# Patient Record
Sex: Female | Born: 1995
Health system: Southern US, Community
[De-identification: ages and names within clinical notes are randomized; demographics above are authoritative.]

## PROBLEM LIST (undated history)

## (undated) DIAGNOSIS — F319 Bipolar disorder, unspecified: Secondary | ICD-10-CM

## (undated) DIAGNOSIS — F419 Anxiety disorder, unspecified: Secondary | ICD-10-CM

## (undated) DIAGNOSIS — F32A Depression, unspecified: Secondary | ICD-10-CM

## (undated) DIAGNOSIS — O24419 Gestational diabetes mellitus in pregnancy, unspecified control: Secondary | ICD-10-CM

## (undated) DIAGNOSIS — Z789 Other specified health status: Secondary | ICD-10-CM

## (undated) HISTORY — DX: Bipolar disorder, unspecified: F31.9

## (undated) HISTORY — DX: Anxiety disorder, unspecified: F41.9

## (undated) HISTORY — DX: Depression, unspecified: F32.A

## (undated) HISTORY — PX: NO PAST SURGERIES: SHX2092

## (undated) HISTORY — DX: Gestational diabetes mellitus in pregnancy, unspecified control: O24.419

---

## 2017-10-26 ENCOUNTER — Emergency Department (HOSPITAL_COMMUNITY)
Admission: EM | Admit: 2017-10-26 | Discharge: 2017-10-26 | Disposition: A | Discharge status: Home / Self Care | Payer: Self-pay | Attending: Emergency Medicine | Admitting: Emergency Medicine

## 2017-10-26 ENCOUNTER — Encounter (HOSPITAL_COMMUNITY): Payer: Self-pay | Admitting: Emergency Medicine

## 2017-10-26 ENCOUNTER — Other Ambulatory Visit: Payer: Self-pay

## 2017-10-26 DIAGNOSIS — K029 Dental caries, unspecified: Secondary | ICD-10-CM | POA: Insufficient documentation

## 2017-10-26 DIAGNOSIS — K047 Periapical abscess without sinus: Secondary | ICD-10-CM | POA: Insufficient documentation

## 2017-10-26 MED ORDER — PENICILLIN V POTASSIUM 500 MG PO TABS: 500.0000 mg | ORAL_TABLET | Freq: Four times a day (QID) | ORAL | 0 refills | Status: AC

## 2017-10-26 MED ORDER — TRAMADOL HCL 50 MG PO TABS: 50.0000 mg | ORAL_TABLET | Freq: Four times a day (QID) | ORAL | 0 refills | Status: DC | PRN

## 2017-10-26 MED ORDER — TRAMADOL HCL 50 MG PO TABS
50.0000 mg | ORAL_TABLET | Freq: Once | ORAL | Status: AC
  Administered 2017-10-26: 50 mg via ORAL
  Filled 2017-10-26: qty 1

## 2017-10-26 MED ORDER — PENICILLIN V POTASSIUM 500 MG PO TABS
500.0000 mg | ORAL_TABLET | Freq: Once | ORAL | Status: AC
  Administered 2017-10-26: 500 mg via ORAL
  Filled 2017-10-26: qty 1

## 2017-10-26 NOTE — ED Notes (Signed)
Patient given discharge teaching and verbalized understanding. Patient ambulated out of ED with a steady gait. 

## 2017-10-26 NOTE — ED Triage Notes (Signed)
Pt c/o bilateral dental pain x several months, has tried to manage pain with OTC pain meds, pain now radiating to ears.

## 2017-10-26 NOTE — ED Provider Notes (Signed)
[facility]-EMERGENCY DEPT Provider Note   CSN: [id number] Arrival date & time: 10/26/17  1023     History   Chief Complaint Chief Complaint  Patient presents with  . Dental Pain    HPI Susan Davis is a 22 y.o. female.  HPI Patient presents with concern of pain, possible fever. She notes that over the past few months she has noticed increasing pain in the right inferior mandible, radiating towards the ear and jaw, but this has become notably worse over the past few days. She is here with her mother who notes that the patient also has had a fever recently. No difficulty swallowing or speaking, but with increasing pain in spite of using OTC medication, possible fever she presents for evaluation.    History reviewed. No pertinent past medical history.  There are no active problems to display for this patient.   History reviewed. No pertinent surgical history.   OB History   None      Home Medications    Prior to Admission medications   Medication Sig Start Date End Date Taking? Authorizing Provider  penicillin v potassium (VEETID) 500 MG tablet Take 1 tablet (500 mg total) by mouth 4 (four) times daily for 7 days. 10/26/17 11/02/17  Gerhard Munch, MD  traMADol (ULTRAM) 50 MG tablet Take 1 tablet (50 mg total) by mouth every 6 (six) hours as needed for severe pain. 10/26/17   Gerhard Munch, MD    Family History History reviewed. No pertinent family history.  Social History Social History   Tobacco Use  . Smoking status: Never Smoker  . Smokeless tobacco: Never Used  Substance Use Topics  . Alcohol use: Not Currently    Frequency: Never  . Drug use: Never     Allergies   Patient has no known allergies.   Review of Systems Review of Systems  Constitutional: Negative for fever.  Respiratory: Negative for shortness of breath.   Cardiovascular: Negative for chest pain.  Musculoskeletal:       Negative aside from HPI    Skin:       Negative aside from HPI  Allergic/Immunologic: Negative for immunocompromised state.  Neurological: Negative for weakness.     Physical Exam Updated Vital Signs BP 116/90   Pulse 77   Temp 98.5 F (36.9 C) (Oral)   Resp 18   Wt 68 kg   LMP 10/24/2017 (Exact Date)   SpO2 98%   Physical Exam  Constitutional: She is oriented to person, place, and time. She appears well-developed and well-nourished. No distress.  HENT:  Head: Normocephalic and atraumatic.  Mouth/Throat: Uvula is midline and oropharynx is clear and moist. Abnormal dentition. Dental caries present. No dental abscesses.    Mild tenderness palpation in the right inferior mandible  Eyes: Conjunctivae and EOM are normal.  Cardiovascular: Normal rate and regular rhythm.  Pulmonary/Chest: Effort normal and breath sounds normal. No stridor. No respiratory distress.  Abdominal: She exhibits no distension.  Musculoskeletal: She exhibits no edema.  Neurological: She is alert and oriented to person, place, and time. No cranial nerve deficit.  Skin: Skin is warm and dry.  Psychiatric: She has a normal mood and affect.  Nursing note and vitals reviewed.    ED Treatments / Results  Labs (all labs ordered are listed, but only abnormal results are displayed) Labs Reviewed - No data to display  EKG None  Radiology No results found.  Procedures Procedures (including critical care time)  Medications  Ordered in ED Medications  penicillin v potassium (VEETID) tablet 500 mg (has no administration in time range)  traMADol (ULTRAM) tablet 50 mg (has no administration in time range)     Initial Impression / Assessment and Plan / ED Course  I have reviewed the triage vital signs and the nursing notes.  Pertinent labs & imaging results that were available during my care of the patient were reviewed by me and considered in my medical decision making (see chart for details).  Young female presents with  concern of fever, mouth pain. No evidence for bacteremia, sepsis, some suspicion for dental infection given her caries Patient started on antibiotics, analgesia provided resources for dental follow-up.  Final Clinical Impressions(s) / ED Diagnoses   Final diagnoses:  Dental infection    ED Discharge Orders         Ordered    penicillin v potassium (VEETID) 500 MG tablet  4 times daily     10/26/17 1044    traMADol (ULTRAM) 50 MG tablet  Every 6 hours PRN     10/26/17 1044           Gerhard Munch, MD 10/26/17 1047

## 2017-10-26 NOTE — Discharge Instructions (Signed)
Please be sure to use the provided resources to follow-up with a dentist.  Return here for concerning changes in your condition.

## 2018-05-18 ENCOUNTER — Emergency Department (HOSPITAL_COMMUNITY)
Admission: EM | Admit: 2018-05-18 | Discharge: 2018-05-18 | Disposition: A | Discharge status: Other Acute Inpatient Hospital | Payer: Self-pay | Attending: Emergency Medicine | Admitting: Emergency Medicine

## 2018-05-18 ENCOUNTER — Other Ambulatory Visit: Payer: Self-pay | Admitting: Behavioral Health

## 2018-05-18 ENCOUNTER — Inpatient Hospital Stay (HOSPITAL_COMMUNITY)
Admission: AD | Admit: 2018-05-18 | Discharge: 2018-05-21 | DRG: 885 | Disposition: A | Discharge status: Home / Self Care | Payer: No Typology Code available for payment source | Source: Intra-hospital | Attending: Psychiatry | Admitting: Psychiatry

## 2018-05-18 ENCOUNTER — Other Ambulatory Visit: Payer: Self-pay

## 2018-05-18 ENCOUNTER — Encounter (HOSPITAL_COMMUNITY): Payer: Self-pay | Admitting: Emergency Medicine

## 2018-05-18 ENCOUNTER — Encounter (HOSPITAL_COMMUNITY): Payer: Self-pay

## 2018-05-18 DIAGNOSIS — F329 Major depressive disorder, single episode, unspecified: Secondary | ICD-10-CM | POA: Diagnosis present

## 2018-05-18 DIAGNOSIS — F3173 Bipolar disorder, in partial remission, most recent episode manic: Secondary | ICD-10-CM

## 2018-05-18 DIAGNOSIS — F1410 Cocaine abuse, uncomplicated: Secondary | ICD-10-CM | POA: Diagnosis present

## 2018-05-18 DIAGNOSIS — R45851 Suicidal ideations: Secondary | ICD-10-CM | POA: Insufficient documentation

## 2018-05-18 DIAGNOSIS — F1210 Cannabis abuse, uncomplicated: Secondary | ICD-10-CM | POA: Insufficient documentation

## 2018-05-18 DIAGNOSIS — F3189 Other bipolar disorder: Principal | ICD-10-CM | POA: Diagnosis present

## 2018-05-18 DIAGNOSIS — F319 Bipolar disorder, unspecified: Secondary | ICD-10-CM | POA: Diagnosis not present

## 2018-05-18 DIAGNOSIS — Z91410 Personal history of adult physical and sexual abuse: Secondary | ICD-10-CM

## 2018-05-18 DIAGNOSIS — G2401 Drug induced subacute dyskinesia: Secondary | ICD-10-CM | POA: Diagnosis present

## 2018-05-18 DIAGNOSIS — F1110 Opioid abuse, uncomplicated: Secondary | ICD-10-CM | POA: Diagnosis present

## 2018-05-18 DIAGNOSIS — Z79899 Other long term (current) drug therapy: Secondary | ICD-10-CM

## 2018-05-18 DIAGNOSIS — F482 Pseudobulbar affect: Secondary | ICD-10-CM | POA: Diagnosis present

## 2018-05-18 DIAGNOSIS — F1220 Cannabis dependence, uncomplicated: Secondary | ICD-10-CM | POA: Diagnosis present

## 2018-05-18 DIAGNOSIS — F1510 Other stimulant abuse, uncomplicated: Secondary | ICD-10-CM | POA: Diagnosis present

## 2018-05-18 DIAGNOSIS — Z91411 Personal history of adult psychological abuse: Secondary | ICD-10-CM

## 2018-05-18 DIAGNOSIS — Z03818 Encounter for observation for suspected exposure to other biological agents ruled out: Secondary | ICD-10-CM | POA: Insufficient documentation

## 2018-05-18 DIAGNOSIS — F332 Major depressive disorder, recurrent severe without psychotic features: Secondary | ICD-10-CM | POA: Insufficient documentation

## 2018-05-18 HISTORY — DX: Other specified health status: Z78.9

## 2018-05-18 LAB — CBC WITH DIFFERENTIAL/PLATELET
Abs Immature Granulocytes: 0.04 10*3/uL (ref 0.00–0.07)
Basophils Absolute: 0.1 10*3/uL (ref 0.0–0.1)
Basophils Relative: 1 %
Eosinophils Absolute: 0.3 10*3/uL (ref 0.0–0.5)
Eosinophils Relative: 2 %
HCT: 40.2 % (ref 36.0–46.0)
Hemoglobin: 13.6 g/dL (ref 12.0–15.0)
Immature Granulocytes: 0 %
Lymphocytes Relative: 33 %
Lymphs Abs: 3.5 10*3/uL (ref 0.7–4.0)
MCH: 29.5 pg (ref 26.0–34.0)
MCHC: 33.8 g/dL (ref 30.0–36.0)
MCV: 87.2 fL (ref 80.0–100.0)
Monocytes Absolute: 0.8 10*3/uL (ref 0.1–1.0)
Monocytes Relative: 7 %
Neutro Abs: 6.2 10*3/uL (ref 1.7–7.7)
Neutrophils Relative %: 57 %
Platelets: 391 10*3/uL (ref 150–400)
RBC: 4.61 MIL/uL (ref 3.87–5.11)
RDW: 12.7 % (ref 11.5–15.5)
WBC: 10.9 10*3/uL — ABNORMAL HIGH (ref 4.0–10.5)
nRBC: 0 % (ref 0.0–0.2)

## 2018-05-18 LAB — COMPREHENSIVE METABOLIC PANEL
ALT: 11 U/L (ref 0–44)
AST: 17 U/L (ref 15–41)
Albumin: 4.5 g/dL (ref 3.5–5.0)
Alkaline Phosphatase: 65 U/L (ref 38–126)
Anion gap: 11 (ref 5–15)
BUN: 9 mg/dL (ref 6–20)
CO2: 18 mmol/L — ABNORMAL LOW (ref 22–32)
Calcium: 9 mg/dL (ref 8.9–10.3)
Chloride: 109 mmol/L (ref 98–111)
Creatinine, Ser: 0.8 mg/dL (ref 0.44–1.00)
GFR calc Af Amer: >60 mL/min (ref >60)
GFR calc non Af Amer: >60 mL/min (ref >60)
Glucose, Bld: 80 mg/dL (ref 70–99)
Potassium: 3.5 mmol/L (ref 3.5–5.1)
Sodium: 138 mmol/L (ref 135–145)
Total Bilirubin: 0.8 mg/dL (ref 0.3–1.2)
Total Protein: 8 g/dL (ref 6.5–8.1)

## 2018-05-18 LAB — RAPID URINE DRUG SCREEN, HOSP PERFORMED
Amphetamines: NOT DETECTED
Barbiturates: NOT DETECTED
Benzodiazepines: NOT DETECTED
Cocaine: NOT DETECTED
Opiates: NOT DETECTED
Tetrahydrocannabinol: POSITIVE — AB

## 2018-05-18 LAB — SALICYLATE LEVEL: Salicylate Lvl: <7 mg/dL (ref 2.8–30.0)

## 2018-05-18 LAB — PREGNANCY, URINE: Preg Test, Ur: NEGATIVE

## 2018-05-18 LAB — ETHANOL: Alcohol, Ethyl (B): <10 mg/dL (ref <10)

## 2018-05-18 LAB — ACETAMINOPHEN LEVEL: Acetaminophen (Tylenol), Serum: <10 ug/mL — ABNORMAL LOW (ref 10–30)

## 2018-05-18 MED ORDER — ALUM & MAG HYDROXIDE-SIMETH 200-200-20 MG/5ML PO SUSP: 30.0000 mL | Freq: Four times a day (QID) | ORAL | Status: DC | PRN

## 2018-05-18 MED ORDER — ACETAMINOPHEN 325 MG PO TABS
650.0000 mg | ORAL_TABLET | Freq: Four times a day (QID) | ORAL | Status: DC | PRN
  Administered 2018-05-19 – 2018-05-21 (×2): 650 mg via ORAL
  Filled 2018-05-18 (×2): qty 2

## 2018-05-18 MED ORDER — LORAZEPAM 1 MG PO TABS
1.0000 mg | ORAL_TABLET | Freq: Once | ORAL | Status: AC
  Administered 2018-05-18: 1 mg via ORAL
  Filled 2018-05-18: qty 1

## 2018-05-18 MED ORDER — LIDOCAINE-PRILOCAINE 2.5-2.5 % EX CREA: TOPICAL_CREAM | Freq: Once | CUTANEOUS | Status: DC

## 2018-05-18 MED ORDER — ALUM & MAG HYDROXIDE-SIMETH 200-200-20 MG/5ML PO SUSP: 30.0000 mL | ORAL | Status: DC | PRN

## 2018-05-18 MED ORDER — ACETAMINOPHEN 325 MG PO TABS: 650.0000 mg | ORAL_TABLET | ORAL | Status: DC | PRN

## 2018-05-18 MED ORDER — MAGNESIUM HYDROXIDE 400 MG/5ML PO SUSP: 30.0000 mL | Freq: Every day | ORAL | Status: DC | PRN

## 2018-05-18 MED ORDER — ONDANSETRON HCL 4 MG PO TABS: 4.0000 mg | ORAL_TABLET | Freq: Three times a day (TID) | ORAL | Status: DC | PRN

## 2018-05-18 MED ORDER — HYDROXYZINE HCL 25 MG PO TABS
25.0000 mg | ORAL_TABLET | Freq: Three times a day (TID) | ORAL | Status: DC | PRN
  Administered 2018-05-18 – 2018-05-21 (×4): 25 mg via ORAL
  Filled 2018-05-18 (×4): qty 1

## 2018-05-18 MED ORDER — TRAZODONE HCL 50 MG PO TABS
50.0000 mg | ORAL_TABLET | Freq: Every evening | ORAL | Status: DC | PRN
  Administered 2018-05-18 – 2018-05-21 (×3): 50 mg via ORAL
  Filled 2018-05-18 (×3): qty 1

## 2018-05-18 NOTE — BHH Counselor (Signed)
  [facility] Disposition Note  Susan Jacks, NP accepted pt into [facility] room 302 bed 01 the attending provider will be Dr. Nehemiah Massed, MD.  The pt can arrive at 9:00 pm on 05/18/18 and report can be called in at [phone].  Nachelle Negrette L. Remus Hagedorn, MS, [facility], NCC Triage Specialist
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
## 2018-05-18 NOTE — ED Notes (Signed)
Pt dressed out in wine colored scrubs and yellow socks.

## 2018-05-18 NOTE — Progress Notes (Signed)
[facility] NOVEL CORONAVIRUS (COVID-19) DAILY CHECK-OFF SYMPTOMS - answer yes or no to each - every day NO YES  Have you had a fever in the past 24 hours?  . Fever (Temp > 37.80C / 100F) X   Have you had any of these symptoms in the past 24 hours? . New Cough .  Sore Throat  .  Shortness of Breath .  Difficulty Breathing .  Unexplained Body Aches   X   Have you had any one of these symptoms in the past 24 hours not related to allergies?   . Runny Nose .  Nasal Congestion .  Sneezing   X   If you have had runny nose, nasal congestion, sneezing in the past 24 hours, has it worsened?  X   EXPOSURES - check yes or no X   Have you traveled outside the state in the past 14 days?  X   Have you been in contact with someone with a confirmed diagnosis of COVID-19 or PUI in the past 14 days without wearing appropriate PPE?  X   Have you been living in the same home as a person with confirmed diagnosis of COVID-19 or a PUI (household contact)?    X   Have you been diagnosed with COVID-19?    X              What to do next: Answered NO to all: Answered YES to anything:   Proceed with unit schedule Follow the [facility] Inpatient Flowsheet.   

## 2018-05-18 NOTE — ED Notes (Addendum)
Attempt once to collect labs unsuccessful due to PT movement. PT refuse to hold arm down

## 2018-05-18 NOTE — ED Notes (Signed)
Pt uncooperative with blood draw, jerking and pulling away, ultimately refusing Lab draw.

## 2018-05-18 NOTE — ED Provider Notes (Signed)
[facility]-EMER[facility]PT Provider Note   CSN: [id number] Arrival date & time: 05/18/18  1119    History   Chief Complaint Chief Complaint  Patient presents with  . Suicidal  . Hallucinations    HPI Susan Davis is a 23 y.o. female.     HPI   23 yo F here with worsening depression. Pt states that she has a h/o depression, has been hospitalized "a few times" for it. Has been off all meds. She reports that due to multiple life stressors, she has had worsening depression which she describes as poor mood. She has also had some auditory hallucinations telling her ot harm herself and others. She states that she is getting extremely frustrated with her family and feels like she has no one to talk to about her depression. She states she has poor coping skills and wants help. She feels like if she does not get help, she will harm herself. No specific plan at this time.  History reviewed. No pertinent past medical history.  There are no active problems to display for this patient.   History reviewed. No pertinent surgical history.   OB History   No obstetric history on file.      Home Medications    Prior to Admission medications   Medication Sig Start Date End Date Taking? Authorizing Provider  diphenhydrAMINE (BENADRYL) 25 mg capsule Take 25 mg by mouth every 6 (six) hours as needed for allergies or sleep.   Yes [provider]  loratadine (CLARITIN) 10 MG tablet Take 10 mg by mouth daily.   Yes [provider]  traMADol (ULTRAM) 50 MG tablet Take 1 tablet (50 mg total) by mouth every 6 (six) hours as needed for severe pain. Patient not taking: Reported on 05/18/2018 10/26/17   Gerhard Munch, MD    Family History No family history on file.  Social History Social History   Tobacco Use  . Smoking status: Never Smoker  . Smokeless tobacco: Never Used  Substance Use Topics  . Alcohol use: Not Currently    Frequency: Never   . Drug use: Never     Allergies   Patient has no known allergies.   Review of Systems Review of Systems  Psychiatric/Behavioral: Positive for decreased concentration, dysphoric mood and suicidal ideas. The patient is nervous/anxious.   All other systems reviewed and are negative.    Physical Exam Updated Vital Signs BP (!) 152/84 (BP Location: Right Arm)   Pulse 94   Temp 99 F (37.2 C) (Oral)   Resp 20   LMP 04/21/2018   SpO2 100%   Physical Exam Vitals signs and nursing note reviewed.  Constitutional:      General: She is not in acute distress.    Appearance: She is well-developed.  HENT:     Head: Normocephalic and atraumatic.  Eyes:     Conjunctiva/sclera: Conjunctivae normal.  Neck:     Musculoskeletal: Neck supple.  Cardiovascular:     Rate and Rhythm: Normal rate and regular rhythm.     Heart sounds: Normal heart sounds. No murmur. No friction rub.  Pulmonary:     Effort: Pulmonary effort is normal. No respiratory distress.     Breath sounds: Normal breath sounds. No wheezing or rales.  Abdominal:     General: There is no distension.     Palpations: Abdomen is soft.     Tenderness: There is no abdominal tenderness.  Skin:    General: Skin is  warm.     Capillary Refill: Capillary refill takes less than 2 seconds.  Neurological:     Mental Status: She is alert and oriented to person, place, and time.     Motor: No abnormal muscle tone.  Psychiatric:        Mood and Affect: Mood is depressed.        Behavior: Behavior is agitated.      ED Treatments / Results  Labs (all labs ordered are listed, but only abnormal results are displayed) Labs Reviewed  COMPREHENSIVE METABOLIC PANEL - Abnormal; Notable for the following components:      Result Value   CO2 18 (*)    All other components within normal limits  RAPID URINE DRUG SCREEN, HOSP PERFORMED - Abnormal; Notable for the following components:   Tetrahydrocannabinol POSITIVE (*)    All other  components within normal limits  CBC WITH DIFFERENTIAL/PLATELET - Abnormal; Notable for the following components:   WBC 10.9 (*)    All other components within normal limits  ACETAMINOPHEN LEVEL - Abnormal; Notable for the following components:   Acetaminophen (Tylenol), Serum <10 (*)    All other components within normal limits  ETHANOL  SALICYLATE LEVEL  PREGNANCY, URINE    EKG EKG Interpretation  Date/Time:  Monday May 18 2018 12:00:34 EDT Ventricular Rate:  83 PR Interval:    QRS Duration: 82 QT Interval:  367 QTC Calculation: 432 R Axis:   76 Text Interpretation:  Sinus rhythm Borderline short PR interval Consider left atrial enlargement No old tracing to compare Reconfirmed by Shaune PollackIsaacs, Mathhew Buysse [phone]) on 05/18/2018 12:10:20 PM   Radiology No results found.  Procedures Procedures (including critical care time)  Medications Ordered in ED Medications  lidocaine-prilocaine (EMLA) cream ( Topical Not Given 05/18/18 1445)  ondansetron (ZOFRAN) tablet 4 mg (has no administration in time range)  LORazepam (ATIVAN) tablet 1 mg (1 mg Oral Given 05/18/18 1228)     Initial Impression / Assessment and Plan / ED Course  I have reviewed the triage vital signs and the nursing notes.  Pertinent labs & imaging results that were available during my care of the patient were reviewed by me and considered in my medical decision making (see chart for details).  Clinical Course as of May 18 1919  Mon May 18, 2018  1149 23 yo F here with worsening depression, SI, AH. Suspect acute on chronic mood d/o, question of prior BPAD. No medical complaints. Screening labs sent, will d/w Psych.   [CI]  1358 Pt refusing labs. Will place EMLA, and she was notified of importance. She remains HDS, well appearing. No apparent emergent medical pathology.    [CI]    Clinical Course User Index [CI] Shaune PollackIsaacs, Mehtab Dolberry, MD        Final Clinical Impressions(s) / ED Diagnoses   Final diagnoses:  Suicidal  ideation  Marijuana abuse    ED Discharge Orders    None       Shaune PollackIsaacs, Divine Hansley, MD 05/18/18 1921

## 2018-05-18 NOTE — ED Triage Notes (Signed)
Pt reports that she wants some "mental help". Pt has been depressed and having SI thoughts for years. Reports auditory hallucinations, denies them telling her to harm herself. Denies plan of SI to this RN at this time. Reports used to be on medications but hasnt taken any in years.

## 2018-05-18 NOTE — Progress Notes (Signed)
UA cup given. Pt verbalized understanding. Pending sample.  

## 2018-05-18 NOTE — ED Notes (Signed)
Called [facility] for transport. 
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
## 2018-05-18 NOTE — Tx Team (Signed)
Initial Treatment Plan 05/18/2018 11:13 PM Susan Davis GYJ:[id number]    PATIENT STRESSORS: Marital or family conflict Substance abuse   PATIENT STRENGTHS: Active sense of humor Communication skills General fund of knowledge Special hobby/interest Supportive family/friends Work skills   PATIENT IDENTIFIED PROBLEMS: "Anxiety"   "Depression"   "At risk for suicide"                  DISCHARGE CRITERIA:  Ability to meet basic life and health needs Improved stabilization in mood, thinking, and/or behavior Verbal commitment to aftercare and medication compliance  PRELIMINARY DISCHARGE PLAN: Attend PHP/IOP Outpatient therapy Return to previous living arrangement Return to previous work or school arrangements  PATIENT/FAMILY INVOLVEMENT: This treatment plan has been presented to and reviewed with the patient, [name].The patient have been given the opportunity to ask questions and make suggestions.  Tyrone Apple, RN 05/18/2018, 11:13 PM

## 2018-05-19 DIAGNOSIS — F3173 Bipolar disorder, in partial remission, most recent episode manic: Secondary | ICD-10-CM

## 2018-05-19 DIAGNOSIS — F319 Bipolar disorder, unspecified: Secondary | ICD-10-CM

## 2018-05-19 LAB — CBC
HCT: 41.1 % (ref 36.0–46.0)
Hemoglobin: 13.3 g/dL (ref 12.0–15.0)
MCH: 28.5 pg (ref 26.0–34.0)
MCHC: 32.4 g/dL (ref 30.0–36.0)
MCV: 88 fL (ref 80.0–100.0)
Platelets: 419 10*3/uL — ABNORMAL HIGH (ref 150–400)
RBC: 4.67 MIL/uL (ref 3.87–5.11)
RDW: 12.6 % (ref 11.5–15.5)
WBC: 9.2 10*3/uL (ref 4.0–10.5)
nRBC: 0 % (ref 0.0–0.2)

## 2018-05-19 LAB — COMPREHENSIVE METABOLIC PANEL
ALT: 13 U/L (ref 0–44)
AST: 17 U/L (ref 15–41)
Albumin: 4.6 g/dL (ref 3.5–5.0)
Alkaline Phosphatase: 65 U/L (ref 38–126)
Anion gap: 11 (ref 5–15)
BUN: 11 mg/dL (ref 6–20)
CO2: 17 mmol/L — ABNORMAL LOW (ref 22–32)
Calcium: 9.1 mg/dL (ref 8.9–10.3)
Chloride: 107 mmol/L (ref 98–111)
Creatinine, Ser: 0.82 mg/dL (ref 0.44–1.00)
GFR calc Af Amer: >60 mL/min (ref >60)
GFR calc non Af Amer: >60 mL/min (ref >60)
Glucose, Bld: 79 mg/dL (ref 70–99)
Potassium: 3.3 mmol/L — ABNORMAL LOW (ref 3.5–5.1)
Sodium: 135 mmol/L (ref 135–145)
Total Bilirubin: 0.7 mg/dL (ref 0.3–1.2)
Total Protein: 8.1 g/dL (ref 6.5–8.1)

## 2018-05-19 LAB — LIPID PANEL
Cholesterol: 149 mg/dL (ref 0–200)
HDL: 57 mg/dL (ref >40)
LDL Cholesterol: 81 mg/dL (ref 0–99)
Total CHOL/HDL Ratio: 2.6 RATIO
Triglycerides: 56 mg/dL (ref <150)
VLDL: 11 mg/dL (ref 0–40)

## 2018-05-19 LAB — TSH: TSH: 1.667 u[IU]/mL (ref 0.350–4.500)

## 2018-05-19 MED ORDER — ZIPRASIDONE MESYLATE 20 MG IM SOLR: 20.0000 mg | Freq: Two times a day (BID) | INTRAMUSCULAR | Status: DC | PRN

## 2018-05-19 MED ORDER — DEXTROMETHORPHAN-QUINIDINE 20-10 MG PO CAPS
1.0000 | ORAL_CAPSULE | Freq: Two times a day (BID) | ORAL | Status: DC
  Administered 2018-05-20 – 2018-05-21 (×3): 1 via ORAL
  Filled 2018-05-19 (×3): qty 1
  Filled 2018-05-19: qty 14
  Filled 2018-05-19 (×2): qty 1
  Filled 2018-05-19: qty 14
  Filled 2018-05-19: qty 1

## 2018-05-19 MED ORDER — CARBAMAZEPINE 100 MG PO CHEW
100.0000 mg | CHEWABLE_TABLET | Freq: Three times a day (TID) | ORAL | Status: DC
  Administered 2018-05-19: 100 mg via ORAL
  Filled 2018-05-19 (×6): qty 1

## 2018-05-19 MED ORDER — CARBAMAZEPINE 200 MG PO TABS
200.0000 mg | ORAL_TABLET | Freq: Three times a day (TID) | ORAL | Status: DC
  Administered 2018-05-20 – 2018-05-21 (×4): 200 mg via ORAL
  Filled 2018-05-19: qty 21
  Filled 2018-05-19 (×4): qty 1
  Filled 2018-05-19: qty 21
  Filled 2018-05-19 (×4): qty 1
  Filled 2018-05-19: qty 21

## 2018-05-19 MED ORDER — ZIPRASIDONE MESYLATE 20 MG IM SOLR
20.0000 mg | Freq: Once | INTRAMUSCULAR | Status: AC
  Administered 2018-05-19: 20 mg via INTRAMUSCULAR
  Filled 2018-05-19 (×2): qty 20

## 2018-05-19 MED ORDER — LORAZEPAM 1 MG PO TABS: 1.0000 mg | ORAL_TABLET | ORAL | Status: DC | PRN

## 2018-05-19 MED ORDER — ZIPRASIDONE MESYLATE 20 MG IM SOLR: 20.0000 mg | Freq: Four times a day (QID) | INTRAMUSCULAR | Status: DC | PRN

## 2018-05-19 MED ORDER — BENZOCAINE 10 % MT GEL
Freq: Four times a day (QID) | OROMUCOSAL | Status: DC | PRN
  Administered 2018-05-19: 1 via OROMUCOSAL
  Administered 2018-05-19: 15:00:00 via OROMUCOSAL
  Filled 2018-05-19: qty 9.4

## 2018-05-19 MED ORDER — PROPRANOLOL HCL 20 MG PO TABS
20.0000 mg | ORAL_TABLET | Freq: Two times a day (BID) | ORAL | Status: DC
  Administered 2018-05-20 – 2018-05-21 (×3): 20 mg via ORAL
  Filled 2018-05-19: qty 2
  Filled 2018-05-19 (×8): qty 1

## 2018-05-19 MED ORDER — FLUOXETINE HCL 20 MG PO CAPS
20.0000 mg | ORAL_CAPSULE | Freq: Every day | ORAL | Status: DC
  Administered 2018-05-19 – 2018-05-21 (×3): 20 mg via ORAL
  Filled 2018-05-19: qty 7
  Filled 2018-05-19 (×5): qty 1

## 2018-05-19 MED ORDER — TEMAZEPAM 30 MG PO CAPS
30.0000 mg | ORAL_CAPSULE | Freq: Every day | ORAL | Status: DC
  Administered 2018-05-19 – 2018-05-21 (×2): 30 mg via ORAL
  Filled 2018-05-19 (×2): qty 1

## 2018-05-19 MED ORDER — CLONAZEPAM 0.5 MG PO TABS
0.5000 mg | ORAL_TABLET | Freq: Three times a day (TID) | ORAL | Status: AC
  Administered 2018-05-19 – 2018-05-21 (×5): 0.5 mg via ORAL
  Filled 2018-05-19 (×5): qty 1
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
## 2018-05-19 NOTE — Progress Notes (Signed)
Patient ID: Susan Davis, female   DOB: 05/30/1995, 23 y.o.   MRN: [id number]  [place] NOVEL CORONAVIRUS (COVID-19) DAILY CHECK-OFF SYMPTOMS - answer yes or no to each - every day NO YES  Have you had a fever in the past 24 hours?  . Fever (Temp > 37.80C / 100F) X   Have you had any of these symptoms in the past 24 hours? . New Cough .  Sore Throat  .  Shortness of Breath .  Difficulty Breathing .  Unexplained Body Aches   X   Have you had any one of these symptoms in the past 24 hours not related to allergies?   . Runny Nose .  Nasal Congestion .  Sneezing   X   If you have had runny nose, nasal congestion, sneezing in the past 24 hours, has it worsened?  X   EXPOSURES - check yes or no X   Have you traveled outside the state in the past 14 days?  X   Have you been in contact with someone with a confirmed diagnosis of COVID-19 or PUI in the past 14 days without wearing appropriate PPE?  X   Have you been living in the same home as a person with confirmed diagnosis of COVID-19 or a PUI (household contact)?    X   Have you been diagnosed with COVID-19?    X              What to do next: Answered NO to all: Answered YES to anything:   Proceed with unit schedule Follow the [facility] Inpatient Flowsheet.

## 2018-05-19 NOTE — Plan of Care (Signed)
Nursing Progress Note [id number]  On initial approach, patient is seen up in the milieu. Patient is loud with intrusive behavior and disorganized, tangential speech. Patient appears to have flight of ideas and affect is very labile. Patient initially pleasant with writer, then became paranoid about taking medications and then tearful. Patient was provided PRN Vistaril this morning. Patient has been seen attempting to disrobe and requires redirection to put scrubs on. Patient is seen hovering at the nurses's station and frequently interrupts staff. MD met with patient and is to move to 500 hall when a bed becomes available. Patient currently denies SI/HI but does appear to be responding to internal stimuli.  Patient is educated about and provided medication per provider's orders. Patient safety maintained with q15 min safety checks and low fall risk precautions. Emotional support given, 1:1 interaction, and active listening provided. Patient encouraged to attend meals, groups, and work on treatment plan and goals. Labs, vital signs and patient behavior monitored throughout shift.   Patient remains safe on the unit at this time. Will continue to support and monitor.   Problem: Safety: Goal: Periods of time without injury will increase Outcome: Progressing   Problem: Nutritional: Goal: Ability to achieve adequate nutritional intake will improve Outcome: Progressing   Problem: Role Relationship: Goal: Ability to interact with others will improve Outcome: Progressing

## 2018-05-20 MED ORDER — RISPERIDONE 2 MG PO TABS
2.0000 mg | ORAL_TABLET | Freq: Three times a day (TID) | ORAL | Status: DC
  Administered 2018-05-20 – 2018-05-21 (×3): 2 mg via ORAL
  Filled 2018-05-20 (×9): qty 1

## 2018-05-20 MED ORDER — BENZTROPINE MESYLATE 0.5 MG PO TABS
0.5000 mg | ORAL_TABLET | Freq: Two times a day (BID) | ORAL | Status: DC
  Administered 2018-05-20 – 2018-05-21 (×3): 0.5 mg via ORAL
  Filled 2018-05-20: qty 1
  Filled 2018-05-20: qty 14
  Filled 2018-05-20 (×3): qty 1
  Filled 2018-05-20: qty 14
  Filled 2018-05-20: qty 1

## 2018-05-20 MED ORDER — LORATADINE 10 MG PO TABS
10.0000 mg | ORAL_TABLET | Freq: Every day | ORAL | Status: DC
  Administered 2018-05-20 – 2018-05-21 (×2): 10 mg via ORAL
  Filled 2018-05-20: qty 7
  Filled 2018-05-20 (×3): qty 1

## 2018-05-20 MED ORDER — RISPERIDONE 2 MG PO TABS: 2.0000 mg | ORAL_TABLET | Freq: Two times a day (BID) | ORAL | Status: DC

## 2018-05-20 NOTE — Progress Notes (Signed)
Recreation Therapy Notes  INPATIENT RECREATION THERAPY ASSESSMENT  Patient Details Name: Susan Davis MRN: [id number] DOB: 04/11/1995 Today's Date: 05/20/2018       Information Obtained From: Patient  Able to Participate in Assessment/Interview: Yes  Patient Presentation: Alert, Anxious  Reason for Admission (Per Patient): Other (Comments)(Pt stated she needed help)  Patient Stressors: Family, Relationship, Work, Other (Comment)(Financial)  Coping Skills:   Isolation, Self-Injury, TV, Sports, Arguments, Aggression, Music, Exercise, Deep Breathing, Meditate, Substance Abuse, Hot Bath/Shower, Impulsivity, Prayer, Avoidance, Intrusive Behavior, Read, Dance, Talk  Leisure Interests (2+):  Sports - Other (Comment), Individual - Phone, Individual - TV, Individual - Other (Comment)(Watch youtube, Cooking shows, Smoke weed)  Frequency of Recreation/Participation: Other (Comment)(Daily)  Awareness of Community Resources:  Yes  Community Resources:  [employer], [place], Public affairs consultant  Current Use: No  If no, Barriers?: Other (Comment)(Afraid of being judged)  Expressed Interest in [employer] Information: No  [place] of Residence:  [place]  Patient Main Form of Transportation: Car  Patient Strengths:  Care for people; Love animals  Patient Identified Areas of Improvement:  Anger; Sadness  Patient Goal for Hospitalization:  "to get help to become best version of myself"  Current SI (including self-harm):  No  Current HI:  No  Current AVH: No  Staff Intervention Plan: Group Attendance, Collaborate with Interdisciplinary Treatment Team  Consent to Intern Participation: N/A    Caroll Rancher, LRT/CTRS  Caroll Rancher A 05/20/2018, 12:33 PM

## 2018-05-20 NOTE — Tx Team (Signed)
Interdisciplinary Treatment and Diagnostic Plan Update  05/20/2018 Time of Session: 4010 Tirzah Yaslene Lindamood MRN: [id number]  Principal Diagnosis: <principal problem not specified>  Secondary Diagnoses: Active Problems:   MDD (major depressive disorder)   Bipolar affective disorder, rapid cycling (HCC)   Current Medications:  Current Facility-Administered Medications  Medication Dose Route Frequency Provider Last Rate Last Dose  . acetaminophen (TYLENOL) tablet 650 mg  650 mg Oral Q6H PRN Derrill Center, NP   650 mg at 05/19/18 1140  . alum & mag hydroxide-simeth (MAALOX/MYLANTA) 200-200-20 MG/5ML suspension 30 mL  30 mL Oral Q4H PRN Derrill Center, NP      . benzocaine (ORAJEL) 10 % mucosal gel   Mouth/Throat QID PRN Cobos, Myer Peer, MD      . benztropine (COGENTIN) tablet 0.5 mg  0.5 mg Oral BID Johnn Hai, MD      . carbamazepine (TEGRETOL) tablet 200 mg  200 mg Oral TID Sharma Covert, MD   200 mg at 05/20/18 0744  . clonazePAM (KLONOPIN) tablet 0.5 mg  0.5 mg Oral TID Johnn Hai, MD   0.5 mg at 05/20/18 0744  . Dextromethorphan-quiNIDine (NUEDEXTA) 20-10 MG per capsule 1 capsule  1 capsule Oral BID Johnn Hai, MD   1 capsule at 05/20/18 0744  . FLUoxetine (PROZAC) capsule 20 mg  20 mg Oral Daily Johnn Hai, MD   20 mg at 05/20/18 0744  . hydrOXYzine (ATARAX/VISTARIL) tablet 25 mg  25 mg Oral TID PRN Derrill Center, NP   25 mg at 05/19/18 2124  . ziprasidone (GEODON) injection 20 mg  20 mg Intramuscular Q12H PRN Sharma Covert, MD       And  . LORazepam (ATIVAN) tablet 1 mg  1 mg Oral PRN Sharma Covert, MD      . magnesium hydroxide (MILK OF MAGNESIA) suspension 30 mL  30 mL Oral Daily PRN Derrill Center, NP      . propranolol (INDERAL) tablet 20 mg  20 mg Oral BID Johnn Hai, MD   20 mg at 05/20/18 0744  . risperiDONE (RISPERDAL) tablet 2 mg  2 mg Oral TID Johnn Hai, MD      . temazepam (RESTORIL) capsule 30 mg  30 mg Oral QHS Johnn Hai, MD   30  mg at 05/19/18 2125  . traZODone (DESYREL) tablet 50 mg  50 mg Oral QHS PRN Derrill Center, NP   50 mg at 05/19/18 2125   PTA Medications: Medications Prior to Admission  Medication Sig Dispense Refill Last Dose  . diphenhydrAMINE (BENADRYL) 25 mg capsule Take 25 mg by mouth every 6 (six) hours as needed for allergies or sleep.   05/16/2018  . loratadine (CLARITIN) 10 MG tablet Take 10 mg by mouth daily.   05/17/2018 at Unknown time  . traMADol (ULTRAM) 50 MG tablet Take 1 tablet (50 mg total) by mouth every 6 (six) hours as needed for severe pain. (Patient not taking: Reported on 05/18/2018) 15 tablet 0 Not Taking at Unknown time    Patient Stressors: Marital or family conflict Substance abuse  Patient Strengths: Active sense of humor Communication skills General fund of knowledge Special hobby/interest Supportive family/friends Work skills  Treatment Modalities: Medication Management, Group therapy, Case management,  1 to 1 session with clinician, Psychoeducation, Recreational therapy.   Physician Treatment Plan for Primary Diagnosis: <principal problem not specified> Long Term Goal(s): Improvement in symptoms so as ready for discharge Improvement in symptoms so as ready for discharge  Short Term Goals: Ability to identify and develop effective coping behaviors will improve Ability to identify and develop effective coping behaviors will improve  Medication Management: Evaluate patient's response, side effects, and tolerance of medication regimen.  Therapeutic Interventions: 1 to 1 sessions, Unit Group sessions and Medication administration.  Evaluation of Outcomes: Not Met  Physician Treatment Plan for Secondary Diagnosis: Active Problems:   MDD (major depressive disorder)   Bipolar affective disorder, rapid cycling (HCC)  Long Term Goal(s): Improvement in symptoms so as ready for discharge Improvement in symptoms so as ready for discharge   Short Term Goals: Ability to  identify and develop effective coping behaviors will improve Ability to identify and develop effective coping behaviors will improve     Medication Management: Evaluate patient's response, side effects, and tolerance of medication regimen.  Therapeutic Interventions: 1 to 1 sessions, Unit Group sessions and Medication administration.  Evaluation of Outcomes: Not Met   RN Treatment Plan for Primary Diagnosis: <principal problem not specified> Long Term Goal(s): Knowledge of disease and therapeutic regimen to maintain health will improve  Short Term Goals: Ability to identify and develop effective coping behaviors will improve and Compliance with prescribed medications will improve  Medication Management: RN will administer medications as ordered by provider, will assess and evaluate patient's response and provide education to patient for prescribed medication. RN will report any adverse and/or side effects to prescribing provider.  Therapeutic Interventions: 1 on 1 counseling sessions, Psychoeducation, Medication administration, Evaluate responses to treatment, Monitor vital signs and CBGs as ordered, Perform/monitor CIWA, COWS, AIMS and Fall Risk screenings as ordered, Perform wound care treatments as ordered.  Evaluation of Outcomes: Not Met   LCSW Treatment Plan for Primary Diagnosis: <principal problem not specified> Long Term Goal(s): Safe transition to appropriate next level of care at discharge, Engage patient in therapeutic group addressing interpersonal concerns.  Short Term Goals: Engage patient in aftercare planning with referrals and resources, Increase social support and Increase skills for wellness and recovery  Therapeutic Interventions: Assess for all discharge needs, 1 to 1 time with Social worker, Explore available resources and support systems, Assess for adequacy in community support network, Educate family and significant other(s) on suicide prevention, Complete  Psychosocial Assessment, Interpersonal group therapy.  Evaluation of Outcomes: Not Met   Progress in Treatment: Attending groups: No. Participating in groups: No. Taking medication as prescribed: Yes. Toleration medication: Yes. Family/Significant other contact made: No, will contact:  mother Patient understands diagnosis: No. Discussing patient identified problems/goals with staff: Yes. Medical problems stabilized or resolved: Yes. Denies suicidal/homicidal ideation: Yes. Issues/concerns per patient self-inventory: No. Other: none  New problem(s) identified: No, Describe:  none  New Short Term/Long Term Goal(s):  Patient Goals:  "be better"  Discharge Plan or Barriers:   Reason for Continuation of Hospitalization: Depression Hallucinations Medication stabilization  Estimated Length of Stay: 3-5 days.  Attendees: Patient: Susan Davis 05/20/2018   Physician: Dr. Jake Samples, MD 05/20/2018   Nursing: Boyce Medici, RN 05/20/2018   RN Care Manager: 05/20/2018   Social Worker: Lurline Idol, LCSW 05/20/2018   Recreational Therapist:  05/20/2018   Other:  05/20/2018   Other:  05/20/2018   Other: 05/20/2018     Scribe for Treatment Team: Joanne Chars, [place] 05/20/2018 10:52 AM
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
## 2018-05-20 NOTE — Progress Notes (Signed)
Psychoeducational Group Note  Date:  05/20/2018 Time:  2042  Group Topic/Focus:  Wrap-Up Group:   The focus of this group is to help patients review their daily goal of treatment and discuss progress on daily workbooks.  Participation Level: Did Not Attend  Participation Quality:  Not Applicable  Affect:  Not Applicable  Cognitive:  Not Applicable  Insight:  Not Applicable  Engagement in Group: Not Applicable  Additional Comments:  The patient did not attend group this evening.   Hazle Coca S 05/20/2018, 8:42 PM

## 2018-05-20 NOTE — Progress Notes (Signed)
Pt present with labile affect, pt complained of net eating super because nobody woke her up. Pt has been observed running on the hallway and required constant redirection.  Pt's safety ensured with 15 minute and environmental checks. Pt currently denies SI/HI and A/V hallucinations. Pt verbally agrees to seek staff if SI/HI or A/VH occurs and to consult with staff before acting on these thoughts. Will continue POC.

## 2018-05-20 NOTE — Progress Notes (Signed)
Recreation Therapy Notes  Date: 5.6.20 Time: 1000 Location: 500 Hall Dayroom   Group Topic: Communication, Team Building, Problem Solving  Goal Area(s) Addresses:  Patient will effectively work with peer towards shared goal.  Patient will identify skill used to make activity successful.  Patient will identify how skills used during activity can be used to reach post d/c goals.   Behavioral Response: Engaged  Intervention: STEM Activity   Activity: [employer]. Patients were provided the following materials: 5 drinking straws, 5 rubber bands, 5 paper clips, 2 index cards, 2 drinking cups, and 2 toilet paper rolls. Using the provided materials patients were asked to build a launching mechanisms to launch a ping pong ball approximately 12 feet. Patients were divided into teams of 3-5.   Education: Pharmacist, community, Building control surveyor.   Education Outcome: Acknowledges education/In group clarification offered/Needs additional education.   Clinical Observations/Feedback: Pt was engaged with peers.  Pt was active but later became agitated with one of her partners because pt felt partner was talking too much and it was getting on her nerves.  Pt left and came back a few times didn't rejoin her group.  Pt sat and listened to the music as it played in the background.    Caroll Rancher, LRT/CTRS    Caroll Rancher A 05/20/2018 11:39 AM

## 2018-05-20 NOTE — Plan of Care (Addendum)
Patient self inventory- Patient slept well last night, sleep mediaction was helpful. Appetite is good, energy level high, concentration good. Depression, hopelessness, and anxiety rated 0, 0, 0 out of 10. Denies SI HI AVH. Denies physical pain. Patient's goal is to work on "everything!" Patient is compliant with medications- no side effects noted. Safety is maintained with 15 minute checks as well as environmental checks. Will continue to monitor and assess.  Problem: Activity: Goal: Interest or engagement in activities will improve Outcome: Progressing   Problem: Education: Goal: Emotional status will improve Outcome: Not Progressing Goal: Mental status will improve Outcome: Not Progressing Goal: Verbalization of understanding the information provided will improve Outcome: Not Progressing

## 2018-05-20 NOTE — [facility] Group Notes (Signed)
Occupational Therapy Group Note  Date:  05/20/2018 Time:  2:35 PM  Group Topic/Focus:  Leisure Group  Participation Level:  Minimal  Participation Quality:  Inattentive and Monopolizing  Affect:  Not Congruent  Cognitive:  Alert  Insight: Lacking  Engagement in Group:  Poor  Modes of Intervention:  Activity, Discussion, Education and Socialization  Additional Comments:    S: "No I don't want to play I just want to watch"  O: OT Group with focus on leisure with card game of Uno. Pt assessed for attention, ability to follow rules and be flexible with other members. Uno game included for pt to name: a coping skill, one thing liked about themselves, one struggle they're facing, and what pt likes to do for fun. Discussion encouraged to promote socialization and relation.  A: Pt presents to group with incongruent affect, minimally engaged and in and out of group room. Pt appearing with attention seeking behavior by walking in and loudly dropping items, or looking at other group members and saying "why are you looking at me like that" for laughs and/or response. Pt came in and out of room stating she was fatigued, then came back, played one round, and left again. Pt came back into group room as group was finished, was animated, bringing another group member and demandingly asking him to play cards.   P: OT group to be x1 per week while pt inpatient.  Dalphine Handing, MSOT, OTR/L Behavioral Health OT/ Acute Relief OT PHP Office: [phone]  Dalphine Handing 05/20/2018, 2:35 PM

## 2018-05-20 NOTE — Progress Notes (Signed)
D: Pt been sleep majority of the evening.  A: Pt was offered support and encouragement.  Pt was encourage to attend groups. Q 15 minute checks were done for safety.  R: safety maintained on unit. 
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
## 2018-05-21 MED ORDER — RISPERIDONE 2 MG PO TABS
4.0000 mg | ORAL_TABLET | Freq: Every day | ORAL | Status: DC
  Filled 2018-05-21: qty 14

## 2018-05-21 MED ORDER — DEXTROMETHORPHAN-QUINIDINE 20-10 MG PO CAPS: 1.0000 | ORAL_CAPSULE | Freq: Two times a day (BID) | ORAL | 2 refills | Status: DC

## 2018-05-21 MED ORDER — OMEGA-3-ACID ETHYL ESTERS 1 G PO CAPS
1.0000 g | ORAL_CAPSULE | Freq: Two times a day (BID) | ORAL | Status: DC
  Filled 2018-05-21 (×3): qty 14

## 2018-05-21 MED ORDER — OMEGA-3-ACID ETHYL ESTERS 1 G PO CAPS: 1.0000 g | ORAL_CAPSULE | Freq: Two times a day (BID) | ORAL | 5 refills | Status: DC

## 2018-05-21 MED ORDER — CARBAMAZEPINE 200 MG PO TABS: ORAL_TABLET | ORAL | 2 refills | Status: DC

## 2018-05-21 MED ORDER — CARBAMAZEPINE 200 MG PO TABS
200.0000 mg | ORAL_TABLET | Freq: Every day | ORAL | Status: DC
  Filled 2018-05-21 (×2): qty 21

## 2018-05-21 MED ORDER — FLUOXETINE HCL 20 MG PO CAPS: 20.0000 mg | ORAL_CAPSULE | Freq: Every day | ORAL | 1 refills | Status: DC

## 2018-05-21 MED ORDER — PROPRANOLOL HCL 20 MG PO TABS: 20.0000 mg | ORAL_TABLET | Freq: Two times a day (BID) | ORAL | 2 refills | Status: DC

## 2018-05-21 MED ORDER — RISPERIDONE 2 MG PO TABS: ORAL_TABLET | ORAL | 1 refills | Status: DC

## 2018-05-21 MED ORDER — BENZTROPINE MESYLATE 0.5 MG PO TABS: 0.5000 mg | ORAL_TABLET | Freq: Two times a day (BID) | ORAL | 0 refills | Status: DC

## 2018-05-21 MED ORDER — TEMAZEPAM 30 MG PO CAPS: 30.0000 mg | ORAL_CAPSULE | Freq: Every evening | ORAL | 0 refills | Status: DC | PRN
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
## 2018-05-21 NOTE — Progress Notes (Signed)
  [facility] Adult Case Management Discharge Plan :  Will you be returning to the same living situation after discharge:  Yes,  with mother At discharge, do you have transportation home?: Yes,  mother Do you have the ability to pay for your medications: No. Will work with [employer].   Release of information consent forms completed and in the chart;  Patient's signature needed at discharge.  Patient to Follow up at: Follow-up Information    [facility] Follow up on 05/28/2018.   Why:  Hospital follow up appointment is 5/14 at 9:00a.  The appointment will be held over the phone and the provider will contact you.  Contact information: [address] [place] [address] [phone]           Next level of care provider has access to [facility] Link:no  Safety Planning and Suicide Prevention discussed: Yes,  with mother  Have you used any form of tobacco in the last 30 days? (Cigarettes, Smokeless Tobacco, Cigars, and/or Pipes): No  Has patient been referred to the Quitline?: N/A patient is not a smoker  Patient has been referred for addiction treatment: Yes, Rachell Cipro, LCSW 05/21/2018, 9:25 AM
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
## 2018-05-21 NOTE — [facility] Group Notes (Signed)
Adult Psychoeducational Group Note  Date:  05/21/2018 Time:  8:48 AM  Group Topic/Focus:  Goals Group:   The focus of this group is to help patients establish daily goals to achieve during treatment and discuss how the patient can incorporate goal setting into their daily lives to aide in recovery.  Participation Level:  Active  Participation Quality:  Appropriate  Affect:  Appropriate  Cognitive:  Alert  Insight: Appropriate  Engagement in Group:  Engaged  Modes of Intervention:  Orientation  Additional Comments:  Pt attended and participated in orientation/goals group. Pt goal for today is to work on her anger.   Dellia Nims 05/21/2018, 8:48 AM

## 2018-05-21 NOTE — [facility] Suicide Risk Assessment (Signed)
[facility] INPATIENT:  Family/Significant Other Suicide Prevention Education  Suicide Prevention Education:  Education Completed; Sharnita Hendrich, mother, [phone], has been identified by the patient as the family member/significant other with whom the patient will be residing, and identified as the person(s) who will aid the patient in the event of a mental health crisis (suicidal ideations/suicide attempt).  With written consent from the patient, the family member/significant other has been provided the following suicide prevention education, prior to the and/or following the discharge of the patient.  The suicide prevention education provided includes the following:  Suicide risk factors  Suicide prevention and interventions  National Suicide Hotline telephone number  [facility] assessment telephone number  [facility] Emergency Assistance 911  [facility] and/or Residential Mobile Crisis Unit telephone number  Request made of family/significant other to:  Remove weapons (e.g., guns, rifles, knives), all items previously/currently identified as safety concern.  No guns, per mother.   Remove drugs/medications (over-the-counter, prescriptions, illicit drugs), all items previously/currently identified as a safety concern.  The family member/significant other verbalizes understanding of the suicide prevention education information provided.  The family member/significant other agrees to remove the items of safety concern listed above.  Mother will assist in getting pt established at [facility] for follow up and she wrote down the information.  Lorri Frederick, LCSW 05/21/2018, 9:24 AM

## 2018-05-21 NOTE — Progress Notes (Signed)
Recreation Therapy Notes  INPATIENT RECREATION TR PLAN  Patient Details Name: Susan Davis MRN: [id number] DOB: [birth year] Today's Date: 05/21/2018  Rec Therapy Plan Is patient appropriate for Therapeutic Recreation?: Yes Treatment times per week: about 3 days Estimated Length of Stay: 5-7 days TR Treatment/Interventions: Group participation (Comment)  Discharge Criteria Pt will be discharged from therapy if:: Discharged Treatment plan/goals/alternatives discussed and agreed upon by:: Patient/family  Discharge Summary Short term goals set: See patient care plan Short term goals met: Adequate for discharge Progress toward goals comments: Groups attended Which groups?: Communication, Other (Comment)(Team building) Reason goals not met: None Therapeutic equipment acquired: N/A Reason patient discharged from therapy: Discharge from hospital Pt/family agrees with progress & goals achieved: Yes Date patient discharged from therapy: 05/21/18    Victorino Sparrow, LRT/CTRS  Ria Comment, [place] 05/21/2018, 11:05 AM

## 2018-05-21 NOTE — Plan of Care (Signed)
Pt was able to identify coping strategies for anger at completion of recreation therapy group sessions.   Caroll Rancher, LRT/CTRS

## 2018-05-21 NOTE — Progress Notes (Signed)
Recreation Therapy Notes  Date: 4.7.20 Time: 1000 Location: 500 Hall Day Room  Group Topic: Communication, Team Building, Problem Solving  Goal Area(s) Addresses:  Patient will effectively work with peer towards shared goal.  Patient will identify skill used to make activity successful.  Patient will identify how skills used during activity can be used to reach post d/c goals.   Behavioral Response: Engaged  Intervention: STEM Activity, Music   Activity:  In groups, patients were given 12 pipe cleaners.  Patients were to build a freestanding tower as tall as possible.  During the course of the activity, patients would face budget cuts.  During those budget cuts, patients would have to put one arm behind their back and on the next round of cuts patients wouldn't be allowed to speak to each other.  When more funding in added, patients would be allowed to use both hands and then speak.  Education: Pharmacist, community, Building control surveyor.   Education Outcome: Acknowledges education  Clinical Observations/Feedback:  Pt worked well with group for a period.  Pt then became more interested in listening to the music than anything.  Pt was appropriate and calm throughout activity.    Caroll Rancher, LRT/CTRS    Caroll Rancher A 05/21/2018 10:31 AM

## 2018-08-25 ENCOUNTER — Emergency Department (HOSPITAL_COMMUNITY)
Admission: EM | Admit: 2018-08-25 | Discharge: 2018-08-25 | Discharge status: Against Medical Advice | Payer: Self-pay | Attending: Emergency Medicine | Admitting: Emergency Medicine

## 2018-08-25 ENCOUNTER — Other Ambulatory Visit: Payer: Self-pay

## 2018-08-25 DIAGNOSIS — Z5321 Procedure and treatment not carried out due to patient leaving prior to being seen by health care provider: Secondary | ICD-10-CM | POA: Insufficient documentation

## 2018-08-25 NOTE — ED Triage Notes (Signed)
Pt reports sinus issues and congestion. Pt reports she has been doing flonase and lomotadine for the congestion with no relief. Pt denies N/V/D SOB, Chest pain, or fevers.

## 2018-08-25 NOTE — ED Notes (Signed)
Patient came up to the nurses station, stating she wanted to leave. Notified Shawn, PA of patient leaving.

## 2018-11-23 ENCOUNTER — Other Ambulatory Visit: Payer: Self-pay

## 2018-11-23 ENCOUNTER — Emergency Department (HOSPITAL_COMMUNITY)
Admission: EM | Admit: 2018-11-23 | Discharge: 2018-11-24 | Disposition: A | Discharge status: Home / Self Care | Payer: Self-pay | Attending: Emergency Medicine | Admitting: Emergency Medicine

## 2018-11-23 ENCOUNTER — Encounter (HOSPITAL_COMMUNITY): Payer: Self-pay

## 2018-11-23 DIAGNOSIS — Z5321 Procedure and treatment not carried out due to patient leaving prior to being seen by health care provider: Secondary | ICD-10-CM | POA: Insufficient documentation

## 2018-11-23 NOTE — ED Triage Notes (Signed)
Pt reports right upper and lower dental pain, denies swelling or abscess, requesting note for work

## 2018-11-24 NOTE — ED Notes (Signed)
No answer for room 

## 2019-10-07 ENCOUNTER — Emergency Department (HOSPITAL_COMMUNITY)
Admission: EM | Admit: 2019-10-07 | Discharge: 2019-10-08 | Disposition: A | Discharge status: Home / Self Care | Payer: Self-pay | Attending: Emergency Medicine | Admitting: Emergency Medicine

## 2019-10-07 ENCOUNTER — Emergency Department (HOSPITAL_COMMUNITY): Payer: Self-pay

## 2019-10-07 DIAGNOSIS — Z20822 Contact with and (suspected) exposure to covid-19: Secondary | ICD-10-CM | POA: Insufficient documentation

## 2019-10-07 DIAGNOSIS — Y92410 Unspecified street and highway as the place of occurrence of the external cause: Secondary | ICD-10-CM | POA: Insufficient documentation

## 2019-10-07 DIAGNOSIS — S3991XA Unspecified injury of abdomen, initial encounter: Secondary | ICD-10-CM | POA: Insufficient documentation

## 2019-10-07 DIAGNOSIS — F309 Manic episode, unspecified: Secondary | ICD-10-CM | POA: Insufficient documentation

## 2019-10-07 DIAGNOSIS — S8992XA Unspecified injury of left lower leg, initial encounter: Secondary | ICD-10-CM | POA: Insufficient documentation

## 2019-10-07 DIAGNOSIS — R4789 Other speech disturbances: Secondary | ICD-10-CM | POA: Insufficient documentation

## 2019-10-07 DIAGNOSIS — F909 Attention-deficit hyperactivity disorder, unspecified type: Secondary | ICD-10-CM | POA: Insufficient documentation

## 2019-10-07 DIAGNOSIS — Y9389 Activity, other specified: Secondary | ICD-10-CM | POA: Insufficient documentation

## 2019-10-07 DIAGNOSIS — S299XXA Unspecified injury of thorax, initial encounter: Secondary | ICD-10-CM | POA: Insufficient documentation

## 2019-10-07 LAB — RAPID URINE DRUG SCREEN, HOSP PERFORMED
Amphetamines: NOT DETECTED
Barbiturates: NOT DETECTED
Benzodiazepines: NOT DETECTED
Cocaine: NOT DETECTED
Opiates: NOT DETECTED
Tetrahydrocannabinol: POSITIVE — AB

## 2019-10-07 LAB — CBC WITH DIFFERENTIAL/PLATELET
Abs Immature Granulocytes: 0.03 10*3/uL (ref 0.00–0.07)
Basophils Absolute: 0.1 10*3/uL (ref 0.0–0.1)
Basophils Relative: 1 %
Eosinophils Absolute: 0.1 10*3/uL (ref 0.0–0.5)
Eosinophils Relative: 1 %
HCT: 41.8 % (ref 36.0–46.0)
Hemoglobin: 14 g/dL (ref 12.0–15.0)
Immature Granulocytes: 0 %
Lymphocytes Relative: 46 %
Lymphs Abs: 5.3 10*3/uL — ABNORMAL HIGH (ref 0.7–4.0)
MCH: 29.2 pg (ref 26.0–34.0)
MCHC: 33.5 g/dL (ref 30.0–36.0)
MCV: 87.3 fL (ref 80.0–100.0)
Monocytes Absolute: 1.1 10*3/uL — ABNORMAL HIGH (ref 0.1–1.0)
Monocytes Relative: 9 %
Neutro Abs: 5 10*3/uL (ref 1.7–7.7)
Neutrophils Relative %: 43 %
Platelets: 377 10*3/uL (ref 150–400)
RBC: 4.79 MIL/uL (ref 3.87–5.11)
RDW: 12.9 % (ref 11.5–15.5)
WBC: 11.5 10*3/uL — ABNORMAL HIGH (ref 4.0–10.5)
nRBC: 0 % (ref 0.0–0.2)

## 2019-10-07 LAB — COMPREHENSIVE METABOLIC PANEL
ALT: 25 U/L (ref 0–44)
AST: 39 U/L (ref 15–41)
Albumin: 4.2 g/dL (ref 3.5–5.0)
Alkaline Phosphatase: 66 U/L (ref 38–126)
Anion gap: 13 (ref 5–15)
BUN: 9 mg/dL (ref 6–20)
CO2: 19 mmol/L — ABNORMAL LOW (ref 22–32)
Calcium: 9.1 mg/dL (ref 8.9–10.3)
Chloride: 105 mmol/L (ref 98–111)
Creatinine, Ser: 1.05 mg/dL — ABNORMAL HIGH (ref 0.44–1.00)
GFR calc Af Amer: >60 mL/min (ref >60)
GFR calc non Af Amer: >60 mL/min (ref >60)
Glucose, Bld: 95 mg/dL (ref 70–99)
Potassium: 3 mmol/L — ABNORMAL LOW (ref 3.5–5.1)
Sodium: 137 mmol/L (ref 135–145)
Total Bilirubin: 0.6 mg/dL (ref 0.3–1.2)
Total Protein: 7.6 g/dL (ref 6.5–8.1)

## 2019-10-07 LAB — ETHANOL: Alcohol, Ethyl (B): <10 mg/dL (ref <10)

## 2019-10-07 LAB — I-STAT BETA HCG BLOOD, ED (MC, WL, AP ONLY): I-stat hCG, quantitative: <5 m[IU]/mL (ref <5)

## 2019-10-07 LAB — SARS CORONAVIRUS 2 BY RT PCR (HOSPITAL ORDER, PERFORMED IN ~~LOC~~ HOSPITAL LAB): SARS Coronavirus 2: NEGATIVE

## 2019-10-07 MED ORDER — HALOPERIDOL LACTATE 5 MG/ML IJ SOLN
5.0000 mg | Freq: Once | INTRAMUSCULAR | Status: AC
  Administered 2019-10-07: 5 mg via INTRAMUSCULAR
  Filled 2019-10-07: qty 1

## 2019-10-07 MED ORDER — IOHEXOL 300 MG/ML  SOLN
100.0000 mL | Freq: Once | INTRAMUSCULAR | Status: AC | PRN
  Administered 2019-10-07: 100 mL via INTRAVENOUS

## 2019-10-07 MED ORDER — LORAZEPAM 2 MG/ML IJ SOLN
2.0000 mg | Freq: Once | INTRAMUSCULAR | Status: AC
  Administered 2019-10-07: 2 mg via INTRAMUSCULAR
  Filled 2019-10-07: qty 1

## 2019-10-07 NOTE — Progress Notes (Signed)
Per Denzil Magnuson, NP patient meets criteria for inpatient treatment.  Referrals have been placed to the following facilities:    [facility]        [facility]        [facility]        [facility]        [facility]        [facility]        [facility]        [facility]        [facility]        [facility]        [facility]        [facility]        [facility]        [facility]       Will continue to follow up on referrals.  Ladoris Gene MSW,LCSWA,LCASA Clinical Social Worker   Disposition, CSW [phone] (cell)

## 2019-10-07 NOTE — ED Notes (Signed)
Assuming care of patient at this time. Pt has physical sitter at bedside. Resp even and unlabored.
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
## 2019-10-07 NOTE — Progress Notes (Signed)
Contacted patients mother to let her know Susan Davis had accepted her daughter and she will be transported in the morning.  Susan Davis MSW,LCSWA,LCASA Clinical Social Worker  [place] Disposition, CSW [phone] (cell)
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
## 2019-10-07 NOTE — [facility] Counselor (Addendum)
Denzil Magnuson, PMHNP recommends in patient treatment. [facility] to review. If no beds available CSW to seek placement.  @1544 : Per - [facility] at capacity. CSW to seek placement.

## 2019-10-07 NOTE — ED Notes (Signed)
Tele psych monitor at bedside 
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
## 2019-10-07 NOTE — Progress Notes (Signed)
Patient ID: Susan Davis, female   DOB: 1995-03-15, 24 y.o.   MRN: [id number]   Per chart review and per evaluation by TTS counselor patient presents with manic behaviors. With the goal to reduce mania, my thoughts were to start an antipsychotic although after review of most recent EKG, patients QTc is prolonged, 526. At this time, I would recommend that patient is given Ativan if needed for agitation/mania and no antipsychotic until her QTc has improved. I further recommend a repeat EKG.

## 2019-10-08 ENCOUNTER — Other Ambulatory Visit: Payer: Self-pay

## 2019-10-08 MED ORDER — LORAZEPAM 2 MG/ML IJ SOLN
2.0000 mg | Freq: Once | INTRAMUSCULAR | Status: AC
  Administered 2019-10-08: 2 mg via INTRAMUSCULAR
  Filled 2019-10-08: qty 1

## 2019-10-08 MED ORDER — HALOPERIDOL LACTATE 5 MG/ML IJ SOLN
5.0000 mg | Freq: Once | INTRAMUSCULAR | Status: AC
  Administered 2019-10-08: 5 mg via INTRAMUSCULAR
  Filled 2019-10-08: qty 1

## 2019-10-08 NOTE — ED Notes (Signed)
Patient changed to blue paper scrubs.

## 2019-10-08 NOTE — ED Notes (Signed)
Patient laughing loudly and wailing all over the bed, up walking through the hallways talking loudly. MD made aware and new orders given.

## 2019-10-08 NOTE — ED Notes (Signed)
Report given to [facility].

## 2019-11-02 ENCOUNTER — Other Ambulatory Visit: Payer: Self-pay

## 2019-11-02 ENCOUNTER — Encounter (HOSPITAL_COMMUNITY): Payer: Self-pay | Admitting: Psychiatry

## 2019-11-02 ENCOUNTER — Other Ambulatory Visit (HOSPITAL_COMMUNITY): Payer: Self-pay | Admitting: Psychiatry

## 2019-11-02 ENCOUNTER — Ambulatory Visit (INDEPENDENT_AMBULATORY_CARE_PROVIDER_SITE_OTHER): Payer: No Payment, Other | Admitting: Psychiatry

## 2019-11-02 VITALS — BP 137/77 | HR 87 | Ht 59.0 in | Wt 180.0 lb

## 2019-11-02 DIAGNOSIS — F3173 Bipolar disorder, in partial remission, most recent episode manic: Secondary | ICD-10-CM

## 2019-11-02 MED ORDER — BENZTROPINE MESYLATE 0.5 MG PO TABS: 0.5000 mg | ORAL_TABLET | Freq: Two times a day (BID) | ORAL | 2 refills | Status: DC

## 2019-11-02 MED ORDER — OLANZAPINE 15 MG PO TABS: 15.0000 mg | ORAL_TABLET | Freq: Every day | ORAL | 2 refills | Status: DC

## 2019-11-02 MED ORDER — DIVALPROEX SODIUM 500 MG PO DR TAB: 500.0000 mg | DELAYED_RELEASE_TABLET | Freq: Two times a day (BID) | ORAL | 2 refills | Status: DC

## 2019-11-02 MED FILL — DIVALPROEX SOD DR 500 MG TA: 500 | 30 days supply | Qty: 60 | Fill #0

## 2019-11-02 MED FILL — BENZTROPINE MES 0.5 MG TAB: 0.5 | 30 days supply | Qty: 60 | Fill #0

## 2019-11-02 MED FILL — OLANZapine 15 MG TABS: 15 | 30 days supply | Qty: 30 | Fill #0
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
## 2019-11-15 ENCOUNTER — Ambulatory Visit (INDEPENDENT_AMBULATORY_CARE_PROVIDER_SITE_OTHER): Payer: No Payment, Other | Admitting: Psychiatry

## 2019-11-15 ENCOUNTER — Other Ambulatory Visit: Payer: Self-pay

## 2019-11-15 ENCOUNTER — Encounter (HOSPITAL_COMMUNITY): Payer: Self-pay | Admitting: Psychiatry

## 2019-11-15 DIAGNOSIS — F3173 Bipolar disorder, in partial remission, most recent episode manic: Secondary | ICD-10-CM | POA: Diagnosis not present

## 2019-11-15 NOTE — Progress Notes (Signed)
BH MD/PA/NP OP Progress Note  11/15/2019 8:31 AM Susan Davis  MRN:  [id number]  Chief Complaint:  Chief Complaint    WALK-IN     "The med are working and i'm doing well"  HPI: 24 year old female seen today for follow up psychiatric evaluation.   She has a psychiatric history of bipolar disorder, anxiety, and depression.  She is currently being managed on Cogentin 0.5 mg daily, Zyprexa 15 mg, and Depakote 500 mg twice daily.  She notes that her medications are effective in managing her psychiatric conditions.  Today she is well-groomed, pleasant, cooperative, engaged in conversation, and maintained eye contact.  She denies depression or anxiety.  A PHQ 9 was done and patient scored a 0. A GAD was also conducted and patient scored a 1. She noted that at times she gets nervous working at [employer] but notes that she is able to mange her anxiety well.  Today she denies symptoms of mania, SI/HI/VAH, or paranoia.   Today patient requested that writer fill out a form for the [facility] regarding her mental health status. She informed Clinical research associate that when she had her manic episode a few months ago she was driving erratically and noted that her licence was temporally suspended. Writer filled out metal health portion of exam and encouraged patient to follow up with her PCP to complete the remainder of the exam. Patient informed writer that she does not have a PCP. Provider gave Patient information on community [facility] and Well[facility].    No medication changes made today.  Patient agreeable to continue medications as prescribed.  No medications ordered today as patient has enough refills until her next visit  with provider in 3 months.  No other concerns noted at this time.  Visit Diagnosis:    ICD-10-CM   1. Bipolar disorder, in partial remission, most recent episode manic (HCC)  F31.73     Past Psychiatric History: Depression, Anxiety, and Bipolar affective Past Medical History:  Past Medical History:   Diagnosis Date  . Medical history non-contributory    No past surgical history on file.  Family Psychiatric History: Patient notes that several people in her family suffer from anxiety and depression however she could not give provider specifics of who they were.   Family History: No family history on file.  Social History:  Social History   Socioeconomic History  . Marital status: Single    Spouse name: Not on file  . Number of children: Not on file  . Years of education: Not on file  . Highest education level: Not on file  Occupational History  . Not on file  Tobacco Use  . Smoking status: Never Smoker  . Smokeless tobacco: Never Used  Vaping Use  . Vaping Use: Never used  Substance and Sexual Activity  . Alcohol use: Yes    Comment: "occasionally"   . Drug use: Yes    Types: Marijuana  . Sexual activity: Not Currently  Other Topics Concern  . Not on file  Social History Narrative  . Not on file   Social Determinants of Health   Financial Resource Strain:   . Difficulty of Paying Living Expenses: Not on file  Food Insecurity:   . Worried About Programme researcher, broadcasting/film/video in the Last Year: Not on file  . Ran Out of Food in the Last Year: Not on file  Transportation Needs:   . Lack of Transportation (Medical): Not on file  . Lack of Transportation (Non-Medical): Not on  file  Physical Activity:   . Days of Exercise per Week: Not on file  . Minutes of Exercise per Session: Not on file  Stress:   . Feeling of Stress : Not on file  Social Connections:   . Frequency of Communication with Friends and Family: Not on file  . Frequency of Social Gatherings with Friends and Family: Not on file  . Attends Religious Services: Not on file  . Active Member of Clubs or Organizations: Not on file  . Attends Banker Meetings: Not on file  . Marital Status: Not on file    Allergies:  Allergies  Allergen Reactions  . Other Other (See Comments)    Allergic to  Unknown Anxiety Medication which causes twitching    Metabolic Disorder Labs: No results found for: HGBA1C, MPG No results found for: PROLACTIN Lab Results  Component Value Date   CHOL 149 05/19/2018   TRIG 56 05/19/2018   HDL 57 05/19/2018   CHOLHDL 2.6 05/19/2018   VLDL 11 05/19/2018   LDLCALC 81 05/19/2018   Lab Results  Component Value Date   TSH 1.667 05/19/2018    Therapeutic Level Labs: No results found for: LITHIUM No results found for: VALPROATE No components found for:  CBMZ  Current Medications: Current Outpatient Medications  Medication Sig Dispense Refill  . benztropine (COGENTIN) 0.5 MG tablet Take 1 tablet (0.5 mg total) by mouth 2 (two) times daily. For 20 days 60 tablet 2  . Dextromethorphan-quiNIDine (NUEDEXTA) 20-10 MG capsule Take 1 capsule by mouth 2 (two) times daily. 60 capsule 2  . divalproex (DEPAKOTE) 500 MG DR tablet Take 1 tablet (500 mg total) by mouth in the morning and at bedtime. 60 tablet 2  . loratadine (CLARITIN) 10 MG tablet Take 10 mg by mouth daily.    Marland Kitchen OLANZapine (ZYPREXA) 15 MG tablet Take 1 tablet (15 mg total) by mouth at bedtime. 30 tablet 2  . omega-3 acid ethyl esters (LOVAZA) 1 g capsule Take 1 capsule (1 g total) by mouth 2 (two) times daily. 60 capsule 5   No current facility-administered medications for this visit.     Musculoskeletal: Strength & Muscle Tone: within normal limits Gait & Station: normal Patient leans: N/A  Psychiatric Specialty Exam: Review of Systems  There were no vitals taken for this visit.There is no height or weight on file to calculate BMI.  General Appearance: Well Groomed  Eye Contact:  Good  Speech:  Clear and Coherent and Normal Rate  Volume:  Normal  Mood:  Irritable  Affect:  Appropriate and Congruent  Thought Process:  Coherent, Goal Directed and Linear  Orientation:  Full (Time, Place, and Person)  Thought Content: WDL and Logical   Suicidal Thoughts:  No  Homicidal Thoughts:  No   Memory:  Immediate;   Good Recent;   Good Remote;   Good  Judgement:  Good  Insight:  Good  Psychomotor Activity:  Normal  Concentration:  Concentration: Good and Attention Span: Good  Recall:  Good  Fund of Knowledge: Good  Language: Good  Akathisia:  No  Handed:  Right  AIMS (if indicated): Not done  Assets:  Communication Skills Desire for Improvement Financial Resources/Insurance Housing Social Support  ADL's:  Intact  Cognition: WNL  Sleep:  Good   Screenings: AIMS     [facility]  AIMS Total Score 0    AUDIT     Admission (Discharged[facility]  Alcohol Use Disorder Identification Test Final Score (AUDIT) 0    PHQ2-9     Office Visit from 11/15/2019 in [facility]  PHQ-2 Total Score 0       Assessment and Plan: Patient reports that she is doing well on current medication regimen. No medication changes made today. She will continue all medications as prescribed.  1. Bipolar disorder, in partial remission, most recent episode manic (HCC)  Follow up in 3 months   Shanna Cisco, NP 11/15/2019, 8:31 AM

## 2019-12-13 MED FILL — DIVALPROEX SOD DR 500 MG TA: 500 | 30 days supply | Qty: 60 | Fill #1

## 2019-12-13 MED FILL — OLANZapine 15 MG TABS: 15 | 30 days supply | Qty: 30 | Fill #1

## 2019-12-13 MED FILL — BENZTROPINE MES 0.5 MG TAB: 0.5 | 30 days supply | Qty: 60 | Fill #1

## 2020-01-17 MED FILL — OLANZapine 15 MG TABS: 15 | 30 days supply | Qty: 30 | Fill #2

## 2020-01-17 MED FILL — DIVALPROEX SOD DR 500 MG TA: 500 | 30 days supply | Qty: 60 | Fill #2

## 2020-01-17 MED FILL — BENZTROPINE MES 0.5 MG TAB: 0.5 | 30 days supply | Qty: 60 | Fill #2

## 2020-01-31 ENCOUNTER — Encounter (HOSPITAL_COMMUNITY): Payer: Self-pay | Admitting: Psychiatry

## 2020-01-31 ENCOUNTER — Other Ambulatory Visit: Payer: Self-pay

## 2020-01-31 ENCOUNTER — Other Ambulatory Visit (HOSPITAL_COMMUNITY): Payer: Self-pay | Admitting: Psychiatry

## 2020-01-31 ENCOUNTER — Telehealth (INDEPENDENT_AMBULATORY_CARE_PROVIDER_SITE_OTHER): Payer: No Payment, Other | Admitting: Psychiatry

## 2020-01-31 DIAGNOSIS — F3173 Bipolar disorder, in partial remission, most recent episode manic: Secondary | ICD-10-CM

## 2020-01-31 MED ORDER — OLANZAPINE 15 MG PO TABS: 15.0000 mg | ORAL_TABLET | Freq: Every day | ORAL | 2 refills | Status: DC

## 2020-01-31 MED ORDER — BENZTROPINE MESYLATE 0.5 MG PO TABS: 0.5000 mg | ORAL_TABLET | Freq: Two times a day (BID) | ORAL | 2 refills | Status: DC

## 2020-01-31 MED ORDER — DIVALPROEX SODIUM 500 MG PO DR TAB: 500.0000 mg | DELAYED_RELEASE_TABLET | Freq: Two times a day (BID) | ORAL | 2 refills | Status: DC

## 2020-01-31 NOTE — Progress Notes (Signed)
BH MD/PA/NP OP Progress Note  01/31/2020 11:17 AM Susan Davis  MRN:  [id number]  Chief Complaint: "Things are great."   HPI: 25 year old female seen today for follow up psychiatric evaluation.   She has a psychiatric history of bipolar disorder, anxiety, and depression.  She is currently being managed on Cogentin 0.5 mg daily, Zyprexa 15 mg, and Depakote 500 mg twice daily.  She notes that her medications are effective in managing her psychiatric conditions.  Today she is well-groomed, pleasant, cooperative, engaged in conversation, and maintained eye contact.  She informed provider that things have been going well since her last visit and notes she has minimal anxiety and depression. Today provider conducted a GAD 7 and patient scored a 2, at last visit patient scored a 1.  A PHQ 9 was also conducted and patient scored a 3, at last visit she scored a 0.Today she denies symptoms of mania, SI/HI/VAH, or paranoia. She endorses adequate sleep and appetite.    Patient informed Clinical research associate that she is still working on getting her licence back. She notes she follows up with her PCP soon and will have them fill out forms regarding her physical health.  No medication changes made today.  Patient agreeable to continue medications as prescribed. No other concerns noted at this time.  Visit Diagnosis:    ICD-10-CM   1. Bipolar disorder, in partial remission, most recent episode manic (HCC)  F31.73 divalproex (DEPAKOTE) 500 MG DR tablet    benztropine (COGENTIN) 0.5 MG tablet    OLANZapine (ZYPREXA) 15 MG tablet    Past Psychiatric History: Depression, Anxiety, and Bipolar affective Past Medical History:  Past Medical History:  Diagnosis Date  . Medical history non-contributory    History reviewed. No pertinent surgical history.  Family Psychiatric History: Patient notes that several people in her family suffer from anxiety and depression however she could not give provider specifics of who  they were.   Family History: History reviewed. No pertinent family history.  Social History:  Social History   Socioeconomic History  . Marital status: Single    Spouse name: Not on file  . Number of children: Not on file  . Years of education: Not on file  . Highest education level: Not on file  Occupational History  . Not on file  Tobacco Use  . Smoking status: Never Smoker  . Smokeless tobacco: Never Used  Vaping Use  . Vaping Use: Never used  Substance and Sexual Activity  . Alcohol use: Yes    Comment: "occasionally"   . Drug use: Yes    Types: Marijuana  . Sexual activity: Not Currently  Other Topics Concern  . Not on file  Social History Narrative  . Not on file   Social Determinants of Health   Financial Resource Strain: Not on file  Food Insecurity: Not on file  Transportation Needs: Not on file  Physical Activity: Not on file  Stress: Not on file  Social Connections: Not on file    Allergies:  Allergies  Allergen Reactions  . Other Other (See Comments)    Allergic to Unknown Anxiety Medication which causes twitching    Metabolic Disorder Labs: No results found for: HGBA1C, MPG No results found for: PROLACTIN Lab Results  Component Value Date   CHOL 149 05/19/2018   TRIG 56 05/19/2018   HDL 57 05/19/2018   CHOLHDL 2.6 05/19/2018   VLDL 11 05/19/2018   LDLCALC 81 05/19/2018   Lab Results  Component Value  Date   TSH 1.667 05/19/2018    Therapeutic Level Labs: No results found for: LITHIUM No results found for: VALPROATE No components found for:  CBMZ  Current Medications: Current Outpatient Medications  Medication Sig Dispense Refill  . benztropine (COGENTIN) 0.5 MG tablet Take 1 tablet (0.5 mg total) by mouth 2 (two) times daily. For 20 days 60 tablet 2  . Dextromethorphan-quiNIDine (NUEDEXTA) 20-10 MG capsule Take 1 capsule by mouth 2 (two) times daily. 60 capsule 2  . divalproex (DEPAKOTE) 500 MG DR tablet Take 1 tablet (500 mg  total) by mouth in the morning and at bedtime. 60 tablet 2  . loratadine (CLARITIN) 10 MG tablet Take 10 mg by mouth daily.    Marland Kitchen OLANZapine (ZYPREXA) 15 MG tablet Take 1 tablet (15 mg total) by mouth at bedtime. 30 tablet 2  . omega-3 acid ethyl esters (LOVAZA) 1 g capsule Take 1 capsule (1 g total) by mouth 2 (two) times daily. 60 capsule 5   No current facility-administered medications for this visit.     Musculoskeletal: Strength & Muscle Tone: within normal limits Gait & Station: normal Patient leans: N/A  Psychiatric Specialty Exam: Review of Systems  There were no vitals taken for this visit.There is no height or weight on file to calculate BMI.  General Appearance: Well Groomed  Eye Contact:  Good  Speech:  Clear and Coherent and Normal Rate  Volume:  Normal  Mood:  Euthymic  Affect:  Appropriate and Congruent  Thought Process:  Coherent, Goal Directed and Linear  Orientation:  Full (Time, Place, and Person)  Thought Content: WDL and Logical   Suicidal Thoughts:  No  Homicidal Thoughts:  No  Memory:  Immediate;   Good Recent;   Good Remote;   Good  Judgement:  Good  Insight:  Good  Psychomotor Activity:  Normal  Concentration:  Concentration: Good and Attention Span: Good  Recall:  Good  Fund of Knowledge: Good  Language: Good  Akathisia:  No  Handed:  Right  AIMS (if indicated): Not done  Assets:  Communication Skills Desire for Improvement Financial Resources/Insurance Housing Social Support  ADL's:  Intact  Cognition: WNL  Sleep:  Good   Screenings: AIMS   Flowsheet Row [facility]  AIMS Total Score 0    AUDIT   Flowsheet Row Ad[facility]  Alcohol Use Disorder Identification Test Final Score (AUDIT) 0    GAD-7   Flowsheet Row Video Visit from 01/31/2020 in [facility]  Total  GAD-7 Score 2    PHQ2-9   Flowsheet Row Video Visit from 01/31/2020 in [facility] Office Visit from 11/15/2019 in [facility]  PHQ-2 Total Score 0 0  PHQ-9 Total Score 3 --       Assessment and Plan: Patient reports that she is doing well on current medication regimen. No medication changes made today. She will continue all medications as prescribed.  1. Bipolar disorder, in partial remission, most recent episode manic (HCC)  Continue- divalproex (DEPAKOTE) 500 MG DR tablet; Take 1 tablet (500 mg total) by mouth in the morning and at bedtime.  Dispense: 60 tablet; Refill: 2 Continue- benztropine (COGENTIN) 0.5 MG tablet; Take 1 tablet (0.5 mg total) by mouth 2 (two) times daily. For 20 days  Dispense: 60 tablet; Refill: 2 Continue- OLANZapine (ZYPREXA) 15 MG tablet; Take  1 tablet (15 mg total) by mouth at bedtime.  Dispense: 30 tablet; Refill: 2   Follow up in 3 months   Shanna Cisco, NP 01/31/2020, 11:17 AM

## 2020-02-21 MED FILL — BENZTROPINE MES 0.5 MG TAB: 0.5 | 30 days supply | Qty: 60 | Fill #0

## 2020-02-21 MED FILL — OLANZapine 15 MG TABS: 15 | 30 days supply | Qty: 30 | Fill #0

## 2020-02-21 MED FILL — DIVALPROEX SOD DR 500 MG TA: 500 | 30 days supply | Qty: 60 | Fill #0

## 2020-04-10 MED FILL — BENZTROPINE MES 0.5 MG TAB: 0.5 | 30 days supply | Qty: 60 | Fill #1

## 2020-04-10 MED FILL — OLANZapine 15 MG TABS: 15 | 30 days supply | Qty: 30 | Fill #1

## 2020-04-10 MED FILL — DIVALPROEX SOD DR 500 MG TA: 500 | 30 days supply | Qty: 60 | Fill #1

## 2020-05-01 ENCOUNTER — Encounter (HOSPITAL_COMMUNITY): Payer: Self-pay | Admitting: Psychiatry

## 2020-05-01 ENCOUNTER — Other Ambulatory Visit: Payer: Self-pay

## 2020-05-01 ENCOUNTER — Telehealth (INDEPENDENT_AMBULATORY_CARE_PROVIDER_SITE_OTHER): Payer: No Payment, Other | Admitting: Psychiatry

## 2020-05-01 DIAGNOSIS — F3173 Bipolar disorder, in partial remission, most recent episode manic: Secondary | ICD-10-CM | POA: Diagnosis not present

## 2020-05-01 MED ORDER — DIVALPROEX SODIUM 500 MG PO DR TAB
DELAYED_RELEASE_TABLET | ORAL | 2 refills | Status: DC
  Filled 2020-05-01 – 2020-05-31 (×2): qty 60, 30d supply, fill #0
  Filled 2020-07-03: qty 60, 30d supply, fill #1

## 2020-05-01 MED ORDER — OLANZAPINE 15 MG PO TABS
ORAL_TABLET | Freq: Every day | ORAL | 2 refills | Status: DC
  Filled 2020-05-01 – 2020-05-31 (×2): qty 30, 30d supply, fill #0
  Filled 2020-07-03: qty 30, 30d supply, fill #1

## 2020-05-01 MED ORDER — BENZTROPINE MESYLATE 0.5 MG PO TABS
ORAL_TABLET | ORAL | 2 refills | Status: DC
  Filled 2020-05-01 – 2020-05-31 (×2): qty 60, 30d supply, fill #0
  Filled 2020-07-03: qty 60, 30d supply, fill #1

## 2020-05-01 NOTE — Progress Notes (Signed)
BH MD/PA/NP OP Progress Note Virtual Visit via Telephone Note  I connected with Susan Davis on 05/01/20 at 11:00 AM EDT by telephone and verified that I am speaking with the correct person using two identifiers.  Location: Patient: home Provider: Clinic   I discussed the limitations, risks, security and privacy concerns of performing an evaluation and management service by telephone and the availability of in person appointments. I also discussed with the patient that there may be a patient responsible charge related to this service. The patient expressed understanding and agreed to proceed.   I provided 30 minutes of non-face-to-face time during this encounter.   05/01/2020 11:23 AM Susan Davis  MRN:  [id number]  Chief Complaint: "Things are going well. I'm taking my medications like I supposed to."   HPI: 25 year old female seen today for follow up psychiatric evaluation.   She has a psychiatric history of bipolar disorder, anxiety, and depression.  She is currently being managed on Cogentin 0.5 mg daily, Zyprexa 15 mg, and Depakote 500 mg twice daily.  She notes that her medications are effective in managing her psychiatric conditions.  Today she was unable to logon virtually so her assessment was done over the phone.  During exam she was pleasant, cooperative, and engaged in conversation.  She informed provider that things have been going well since her last visit.  She informed Clinical research associate that she is still in the process of getting her license back.  She notes she has to follow-up with her primary care doctor to get forms filled out.  She informed Clinical research associate that she has very minimal anxiety and depression.  Today provider conducted a GAD-7 and patient scored a 0, at her last visit she scored 2.  Provider also conducted a PHQ-9 and patient scored a 2, at her last visit she scored a 3.  Today she denies symptoms of mania, SI/HI/VAH, or paranoia. She endorses adequate sleep and  appetite.    No medication changes made today.  Patient agreeable to continue medications as prescribed. No other concerns noted at this time.  Visit Diagnosis:    ICD-10-CM   1. Bipolar disorder, in partial remission, most recent episode manic (HCC)  F31.73 benztropine (COGENTIN) 0.5 MG tablet    divalproex (DEPAKOTE) 500 MG DR tablet    OLANZapine (ZYPREXA) 15 MG tablet    Past Psychiatric History: Depression, Anxiety, and Bipolar affective Past Medical History:  Past Medical History:  Diagnosis Date  . Medical history non-contributory    No past surgical history on file.  Family Psychiatric History: Patient notes that several people in her family suffer from anxiety and depression however she could not give provider specifics of who they were.   Family History: No family history on file.  Social History:  Social History   Socioeconomic History  . Marital status: Single    Spouse name: Not on file  . Number of children: Not on file  . Years of education: Not on file  . Highest education level: Not on file  Occupational History  . Not on file  Tobacco Use  . Smoking status: Never Smoker  . Smokeless tobacco: Never Used  Vaping Use  . Vaping Use: Never used  Substance and Sexual Activity  . Alcohol use: Yes    Comment: "occasionally"   . Drug use: Yes    Types: Marijuana  . Sexual activity: Not Currently  Other Topics Concern  . Not on file  Social History Narrative  . Not  on file   Social Determinants of Health   Financial Resource Strain: Not on file  Food Insecurity: Not on file  Transportation Needs: Not on file  Physical Activity: Not on file  Stress: Not on file  Social Connections: Not on file    Allergies:  Allergies  Allergen Reactions  . Other Other (See Comments)    Allergic to Unknown Anxiety Medication which causes twitching    Metabolic Disorder Labs: No results found for: HGBA1C, MPG No results found for: PROLACTIN Lab Results   Component Value Date   CHOL 149 05/19/2018   TRIG 56 05/19/2018   HDL 57 05/19/2018   CHOLHDL 2.6 05/19/2018   VLDL 11 05/19/2018   LDLCALC 81 05/19/2018   Lab Results  Component Value Date   TSH 1.667 05/19/2018    Therapeutic Level Labs: No results found for: LITHIUM No results found for: VALPROATE No components found for:  CBMZ  Current Medications: Current Outpatient Medications  Medication Sig Dispense Refill  . benztropine (COGENTIN) 0.5 MG tablet TAKE 1 TABLET (0.5 MG TOTAL) BY MOUTH 2 (TWO) TIMES DAILY 60 tablet 2  . Dextromethorphan-quiNIDine (NUEDEXTA) 20-10 MG capsule Take 1 capsule by mouth 2 (two) times daily. 60 capsule 2  . divalproex (DEPAKOTE) 500 MG DR tablet TAKE 1 TABLET (500 MG TOTAL) BY MOUTH IN THE MORNING AND AT BEDTIME. 60 tablet 2  . loratadine (CLARITIN) 10 MG tablet Take 10 mg by mouth daily.    Marland Kitchen OLANZapine (ZYPREXA) 15 MG tablet TAKE 1 TABLET (15 MG TOTAL) BY MOUTH AT BEDTIME. 30 tablet 2  . omega-3 acid ethyl esters (LOVAZA) 1 g capsule Take 1 capsule (1 g total) by mouth 2 (two) times daily. 60 capsule 5   No current facility-administered medications for this visit.     Musculoskeletal: Strength & Muscle Tone: Unable to assess due to telephone visit Gait & Station: Unable to assess due to telephone visit Patient leans: N/A  Psychiatric Specialty Exam: Review of Systems  There were no vitals taken for this visit.There is no height or weight on file to calculate BMI.  General Appearance: Unable to assess due to telephone visit  Eye Contact:  Unable to assess due to telephone visit  Speech:  Clear and Coherent and Normal Rate  Volume:  Normal  Mood:  Euthymic  Affect:  Appropriate and Congruent  Thought Process:  Coherent, Goal Directed and Linear  Orientation:  Full (Time, Place, and Person)  Thought Content: WDL and Logical   Suicidal Thoughts:  No  Homicidal Thoughts:  No  Memory:  Immediate;   Good Recent;   Good Remote;   Good   Judgement:  Good  Insight:  Good  Psychomotor Activity:  Normal  Concentration:  Concentration: Good and Attention Span: Good  Recall:  Good  Fund of Knowledge: Good  Language: Good  Akathisia:  No  Handed:  Right  AIMS (if indicated): Not done  Assets:  Communication Skills Desire for Improvement Financial Resources/Insurance Housing Social Support  ADL's:  Intact  Cognition: WNL  Sleep:  Good   Screenings: AIMS   Flowsheet Row [facility]  AIMS Total Score 0    AUDIT   Flowsheet Row Ad[facility]  Alcohol Use Disorder Identification Test Final Score (AUDIT) 0    GAD-7   Flowsheet Row Video Visit from 05/01/2020 in [facility] Video Visit  from 01/31/2020 in [facility]  Total GAD-7 Score 0 2    PHQ2-9   Flowsheet Row Video Visit from 05/01/2020 in [facility] Video Visit from 01/31/2020 in [facility] Office Visit from 11/15/2019 in [facility]  PHQ-2 Total Score 0 0 0  PHQ-9 Total Score 2 3 --    Flowsheet Row Video Visit from 05/01/2020 in [facility] Most recent reading at 05/01/2020 11:16 AM [facility] Most recent reading at 05/18/2018 11:00 PM ED from 05/18/2018 in [facility] Most recent reading at 05/18/2018 11:34 AM  C-SSRS RISK CATEGORY No Risk Moderate Risk Error: Q6 is Yes, you must answer 7       Assessment and Plan: Patient reports that she is doing well on current medication regimen. No medication changes made today. She will continue all medications as prescribed.  1. Bipolar disorder, in partial remission, most recent episode manic  (HCC)  Continue- divalproex (DEPAKOTE) 500 MG DR tablet; Take 1 tablet (500 mg total) by mouth in the morning and at bedtime.  Dispense: 60 tablet; Refill: 2 Continue- benztropine (COGENTIN) 0.5 MG tablet; Take 1 tablet (0.5 mg total) by mouth 2 (two) times daily. For 20 days  Dispense: 60 tablet; Refill: 2 Continue- OLANZapine (ZYPREXA) 15 MG tablet; Take 1 tablet (15 mg total) by mouth at bedtime.  Dispense: 30 tablet; Refill: 2   Follow up in 3 months   Shanna Cisco, NP 05/01/2020, 11:23 AM

## 2020-05-31 ENCOUNTER — Other Ambulatory Visit: Payer: Self-pay

## 2020-06-26 ENCOUNTER — Telehealth (HOSPITAL_COMMUNITY): Payer: Self-pay | Admitting: Psychiatry

## 2020-06-26 NOTE — Telephone Encounter (Addendum)
Patient dropped off DOT forms requesting provider fill out forms for medical verification of pt ability to drive a motor vehicle.  Pt says PCP will fill out first portion and has requested Dr. Doyne Keel fill out Section F.  Placed forms in providers bin. Please let pt know if provider will be able to complete forms during their virtual visit on 07/31/20.

## 2020-06-28 NOTE — Telephone Encounter (Signed)
Provider spoke to patient regarding DMV forms.  Provider filled out forms and discussed recommendations with patient's.  Provider notes that patient should have medical evaluation every 6 months to reevaluate her driving.  She will continue to see writer every 3 months for cancer evaluation and medication management.  She endorsed understanding and agreed.  No other concerns noted at this time.

## 2020-07-03 ENCOUNTER — Other Ambulatory Visit: Payer: Self-pay

## 2020-07-31 ENCOUNTER — Encounter (HOSPITAL_COMMUNITY): Payer: Self-pay | Admitting: Psychiatry

## 2020-07-31 ENCOUNTER — Other Ambulatory Visit: Payer: Self-pay

## 2020-07-31 ENCOUNTER — Telehealth (INDEPENDENT_AMBULATORY_CARE_PROVIDER_SITE_OTHER): Payer: No Payment, Other | Admitting: Psychiatry

## 2020-07-31 DIAGNOSIS — F3173 Bipolar disorder, in partial remission, most recent episode manic: Secondary | ICD-10-CM | POA: Diagnosis not present

## 2020-07-31 MED ORDER — BENZTROPINE MESYLATE 0.5 MG PO TABS
ORAL_TABLET | ORAL | 2 refills | Status: DC
  Filled 2020-07-31 – 2020-08-07 (×2): qty 60, 30d supply, fill #0
  Filled 2020-09-30: qty 60, 30d supply, fill #1

## 2020-07-31 MED ORDER — OLANZAPINE 15 MG PO TABS
ORAL_TABLET | Freq: Every day | ORAL | 2 refills | Status: DC
  Filled 2020-07-31 – 2020-08-07 (×2): qty 30, 30d supply, fill #0

## 2020-07-31 MED ORDER — DIVALPROEX SODIUM 500 MG PO DR TAB
DELAYED_RELEASE_TABLET | ORAL | 2 refills | Status: DC
  Filled 2020-07-31 – 2020-08-07 (×2): qty 60, 30d supply, fill #0
  Filled 2020-09-30: qty 60, 30d supply, fill #1

## 2020-07-31 NOTE — Progress Notes (Signed)
BH MD/PA/NP OP Progress Note Virtual Visit via Video Note  I connected with Susan Davis on 07/31/20 at  1:30 PM EDT by a video enabled telemedicine application and verified that I am speaking with the correct person using two identifiers.  Location: Patient: Home Provider: Clinic   I discussed the limitations of evaluation and management by telemedicine and the availability of in person appointments. The patient expressed understanding and agreed to proceed.  I provided 30 minutes of non-face-to-face time during this encounter.     07/31/2020 1:36 PM Susan Davis  MRN:  [id number]  Chief Complaint: "Things are going well."   HPI: 25 year old female seen today for follow up psychiatric evaluation.   She has a psychiatric history of bipolar disorder, anxiety, and depression.  She is currently being managed on Cogentin 0.5 mg daily, Zyprexa 15 mg, and Depakote 500 mg twice daily.  She notes that her medications are effective in managing her psychiatric conditions.   Today she is well groomed, pleasant, cooperative, engaged in conversation, and maintained eye contact.  She informed provider that things have been going well since her last visit.  She notes that currently she is getting her hair done for an upcoming anniversary trip. She notes she and her boyfriend will be spending the weekend in [place]. She informed Clinical research associate that she has very minimal anxiety and depression.  Today provider conducted a GAD-7 and patient scored a 1, at her last visit she scored 0.  Provider also conducted a PHQ-9 and patient scored a 4, at her last visit she scored a 2.  Today she denies symptoms of mania, SI/HI/VAH, or paranoia. She endorses adequate sleep and appetite. Patient notes that since her last visit she has gained 10 pounds. Provider informed patient that Zyprexa can contribute to weight gain and encouraged patient to continue to eat a healthy diet and exercises. She endorsed understanding  and agreed.    No medication changes made today.  Patient agreeable to continue medications as prescribed. No other concerns noted at this time.   Visit Diagnosis:    ICD-10-CM   1. Bipolar disorder, in partial remission, most recent episode manic (HCC)  F31.73 benztropine (COGENTIN) 0.5 MG tablet    divalproex (DEPAKOTE) 500 MG DR tablet    OLANZapine (ZYPREXA) 15 MG tablet      Past Psychiatric History: Depression, Anxiety, and Bipolar affective Past Medical History:  Past Medical History:  Diagnosis Date   Medical history non-contributory    No past surgical history on file.  Family Psychiatric History: Patient notes that several people in her family suffer from anxiety and depression however she could not give provider specifics of who they were.    Family History: No family history on file.  Social History:  Social History   Socioeconomic History   Marital status: Single    Spouse name: Not on file   Number of children: Not on file   Years of education: Not on file   Highest education level: Not on file  Occupational History   Not on file  Tobacco Use   Smoking status: Never   Smokeless tobacco: Never  Vaping Use   Vaping Use: Never used  Substance and Sexual Activity   Alcohol use: Yes    Comment: "occasionally"    Drug use: Yes    Types: Marijuana   Sexual activity: Not Currently  Other Topics Concern   Not on file  Social History Narrative   Not on file  Social Determinants of Health   Financial Resource Strain: Not on file  Food Insecurity: Not on file  Transportation Needs: Not on file  Physical Activity: Not on file  Stress: Not on file  Social Connections: Not on file    Allergies:  Allergies  Allergen Reactions   Other Other (See Comments)    Allergic to Unknown Anxiety Medication which causes twitching    Metabolic Disorder Labs: No results found for: HGBA1C, MPG No results found for: PROLACTIN Lab Results  Component Value Date    CHOL 149 05/19/2018   TRIG 56 05/19/2018   HDL 57 05/19/2018   CHOLHDL 2.6 05/19/2018   VLDL 11 05/19/2018   LDLCALC 81 05/19/2018   Lab Results  Component Value Date   TSH 1.667 05/19/2018    Therapeutic Level Labs: No results found for: LITHIUM No results found for: VALPROATE No components found for:  CBMZ  Current Medications: Current Outpatient Medications  Medication Sig Dispense Refill   benztropine (COGENTIN) 0.5 MG tablet TAKE 1 TABLET (0.5 MG TOTAL) BY MOUTH 2 (TWO) TIMES DAILY 60 tablet 2   Dextromethorphan-quiNIDine (NUEDEXTA) 20-10 MG capsule Take 1 capsule by mouth 2 (two) times daily. 60 capsule 2   divalproex (DEPAKOTE) 500 MG DR tablet TAKE 1 TABLET (500 MG TOTAL) BY MOUTH IN THE MORNING AND AT BEDTIME 60 tablet 2   loratadine (CLARITIN) 10 MG tablet Take 10 mg by mouth daily.     OLANZapine (ZYPREXA) 15 MG tablet TAKE 1 TABLET (15 MG TOTAL) BY MOUTH AT BEDTIME. 30 tablet 2   omega-3 acid ethyl esters (LOVAZA) 1 g capsule Take 1 capsule (1 g total) by mouth 2 (two) times daily. 60 capsule 5   No current facility-administered medications for this visit.     Musculoskeletal: Strength & Muscle Tone:  Unable to assess due to telehealth visit Gait & Station:  Unable to assess due to telehealth visit Patient leans: N/A  Psychiatric Specialty Exam: Review of Systems  There were no vitals taken for this visit.There is no height or weight on file to calculate BMI.  General Appearance: Well Groomed  Eye Contact:  Good  Speech:  Clear and Coherent and Normal Rate  Volume:  Normal  Mood:  Euthymic  Affect:  Appropriate and Congruent  Thought Process:  Coherent, Goal Directed and Linear  Orientation:  Full (Time, Place, and Person)  Thought Content: WDL and Logical   Suicidal Thoughts:  No  Homicidal Thoughts:  No  Memory:  Immediate;   Good Recent;   Good Remote;   Good  Judgement:  Good  Insight:  Good  Psychomotor Activity:  Normal  Concentration:   Concentration: Good and Attention Span: Good  Recall:  Good  Fund of Knowledge: Good  Language: Good  Akathisia:  No  Handed:  Right  AIMS (if indicated): Not done  Assets:  Communication Skills Desire for Improvement Financial Resources/Insurance Housing Social Support  ADL's:  Intact  Cognition: WNL  Sleep:  Good   Screenings: AIMS    Flowsheet Row [facility]  AIMS Total Score 0      AUDIT    Flowsheet Row [facility]  Alcohol Use Disorder Identification Test Final Score (AUDIT) 0      GAD-7    Flowsheet Row Video Visit from 07/31/2020 in [facility] Video Visit from [facility]/01/2020 in [facility]/31/2020 in [facility]  Total GAD-7 Score 1 0 2      PHQ2-9    Flowsheet Row Video Visit from 07/31/2020 in [facility] Video Visit from 05/01/2020 in [facility] Video Visit from 01/31/2020 in [facility] Office Visit from 11/15/2019 in [facility]  PHQ-2 Total Score 1 0 0 0  PHQ-9 Total Score 4 2 3  --      Flowsheet Row Video Visit from 05/01/2020 in [facility] Most recent reading at 05/01/2020 11:16 AM Ad[facility] ([facility]charged[facility] Most recent reading at 05/18/2018 11:00 PM ED from 05/18/2018 in [facility] Most recent reading at 05/18/2018 11:34 AM  C-SSRS RISK CATEGORY No Risk Moderate Risk Error: Q6 is Yes, you must answer 7        Assessment and Plan: Patient reports that she is doing well on current medication regimen. No medication changes made today. She will continue all medications as  prescribed.  1. Bipolar disorder, in partial remission, most recent episode manic (HCC)  Continue- divalproex (DEPAKOTE) 500 MG DR tablet; Take 1 tablet (500 mg total) by mouth in the morning and at bedtime.  Dispense: 60 tablet; Refill: 2 Continue- benztropine (COGENTIN) 0.5 MG tablet; Take 1 tablet (0.5 mg total) by mouth 2 (two) times daily. For 20 days  Dispense: 60 tablet; Refill: 2 Continue- OLANZapine (ZYPREXA) 15 MG tablet; Take 1 tablet (15 mg total) by mouth at bedtime.  Dispense: 30 tablet; Refill: 2   Follow up in 3 months   07/18/2018, NP 07/31/2020, 1:36 PM

## 2020-08-07 ENCOUNTER — Other Ambulatory Visit: Payer: Self-pay

## 2020-08-08 ENCOUNTER — Telehealth (HOSPITAL_COMMUNITY): Payer: Self-pay | Admitting: Psychiatry

## 2020-08-08 ENCOUNTER — Other Ambulatory Visit (HOSPITAL_COMMUNITY): Payer: Self-pay | Admitting: Psychiatry

## 2020-08-08 DIAGNOSIS — F3173 Bipolar disorder, in partial remission, most recent episode manic: Secondary | ICD-10-CM

## 2020-08-08 NOTE — Telephone Encounter (Signed)
Patient requesting provider call to discuss medication. Per pt she has been taking medication for a long time and has concerns about possible side effects Last visit was 07/31/20 Confirmed pt ph [phone].

## 2020-08-08 NOTE — Telephone Encounter (Signed)
Patient notes when she picked her medications up from the pharmacy it detailed her medication side effects. She notes that she was concerned about potential liver damage and notes that she is considering discontinuing all of her medications. Provider discussed side effects of patient medications and informed her that all medications may have side effects. Provider recommended drawing labs to assess her liver function and metabolic functions. Provider ordered a LFT, BMP, CBC, and Prolactin levels. She notes that she will come to the clinic on Monday to have labs drawn. No other concerns noted at this time.

## 2020-08-14 ENCOUNTER — Other Ambulatory Visit: Payer: Self-pay

## 2020-08-14 ENCOUNTER — Ambulatory Visit (HOSPITAL_COMMUNITY): Payer: No Payment, Other

## 2020-08-15 LAB — CBC WITH DIFFERENTIAL/PLATELET
Basophils Absolute: 0 10*3/uL (ref 0.0–0.2)
Basos: 1 %
EOS (ABSOLUTE): 0.2 10*3/uL (ref 0.0–0.4)
Eos: 3 %
Hematocrit: 38.7 % (ref 34.0–46.6)
Hemoglobin: 13.3 g/dL (ref 11.1–15.9)
Immature Grans (Abs): 0 10*3/uL (ref 0.0–0.1)
Immature Granulocytes: 0 %
Lymphocytes Absolute: 3.4 10*3/uL — ABNORMAL HIGH (ref 0.7–3.1)
Lymphs: 44 %
MCH: 30.2 pg (ref 26.6–33.0)
MCHC: 34.4 g/dL (ref 31.5–35.7)
MCV: 88 fL (ref 79–97)
Monocytes Absolute: 0.6 10*3/uL (ref 0.1–0.9)
Monocytes: 8 %
Neutrophils Absolute: 3.5 10*3/uL (ref 1.4–7.0)
Neutrophils: 44 %
Platelets: 268 10*3/uL (ref 150–450)
RBC: 4.4 x10E6/uL (ref 3.77–5.28)
RDW: 12.9 % (ref 11.7–15.4)
WBC: 7.8 10*3/uL (ref 3.4–10.8)

## 2020-08-15 LAB — HEPATIC FUNCTION PANEL
ALT: 16 IU/L (ref 0–32)
AST: 19 IU/L (ref 0–40)
Albumin: 4.3 g/dL (ref 3.9–5.0)
Alkaline Phosphatase: 63 IU/L (ref 44–121)
Bilirubin Total: 0.3 mg/dL (ref 0.0–1.2)
Bilirubin, Direct: <0.1 mg/dL (ref 0.00–0.40)
Total Protein: 7.2 g/dL (ref 6.0–8.5)

## 2020-08-15 LAB — BASIC METABOLIC PANEL
BUN/Creatinine Ratio: 8 — ABNORMAL LOW (ref 9–23)
BUN: 7 mg/dL (ref 6–20)
CO2: 22 mmol/L (ref 20–29)
Calcium: 9.4 mg/dL (ref 8.7–10.2)
Chloride: 102 mmol/L (ref 96–106)
Creatinine, Ser: 0.83 mg/dL (ref 0.57–1.00)
Glucose: 89 mg/dL (ref 65–99)
Potassium: 4.1 mmol/L (ref 3.5–5.2)
Sodium: 137 mmol/L (ref 134–144)
eGFR: 100 mL/min/{1.73_m2} (ref >59)

## 2020-08-15 LAB — PROLACTIN: Prolactin: 52.1 ng/mL — ABNORMAL HIGH (ref 4.8–23.3)

## 2020-08-16 ENCOUNTER — Telehealth (INDEPENDENT_AMBULATORY_CARE_PROVIDER_SITE_OTHER): Payer: No Payment, Other | Admitting: Psychiatry

## 2020-08-16 ENCOUNTER — Other Ambulatory Visit: Payer: Self-pay

## 2020-08-16 ENCOUNTER — Other Ambulatory Visit (HOSPITAL_COMMUNITY): Payer: Self-pay | Admitting: Psychiatry

## 2020-08-16 DIAGNOSIS — R7989 Other specified abnormal findings of blood chemistry: Secondary | ICD-10-CM

## 2020-08-16 DIAGNOSIS — F3173 Bipolar disorder, in partial remission, most recent episode manic: Secondary | ICD-10-CM

## 2020-08-16 MED ORDER — OLANZAPINE 10 MG PO TABS
10.0000 mg | ORAL_TABLET | Freq: Every day | ORAL | 2 refills | Status: DC
  Filled 2020-08-16 – 2020-08-28 (×2): qty 30, 30d supply, fill #0
  Filled 2020-09-30: qty 30, 30d supply, fill #1

## 2020-08-16 NOTE — Telephone Encounter (Signed)
Provider called patient to discuss lab values.  Provider informed patient that her prolactin level is elevated and recommended reducing Zyprexa 15mg  to 10 mg which she was agreeable.  Provider discussed patient's BUN/creatinine was reduced however not concerning as well as her lymphocytes being slightly elevated.  At this time patient notes that she feels physically and mentally stable.  She denies leakage of milk from her breast or any other sickness.  No other concerns at this time.

## 2020-08-16 NOTE — Progress Notes (Signed)
Labs reviewed and discussed with patient

## 2020-08-23 ENCOUNTER — Other Ambulatory Visit: Payer: Self-pay

## 2020-08-28 ENCOUNTER — Other Ambulatory Visit: Payer: Self-pay

## 2020-10-02 ENCOUNTER — Other Ambulatory Visit: Payer: Self-pay

## 2020-10-30 ENCOUNTER — Other Ambulatory Visit: Payer: Self-pay

## 2020-10-30 ENCOUNTER — Telehealth (INDEPENDENT_AMBULATORY_CARE_PROVIDER_SITE_OTHER): Payer: No Payment, Other | Admitting: Psychiatry

## 2020-10-30 ENCOUNTER — Encounter (HOSPITAL_COMMUNITY): Payer: Self-pay | Admitting: Psychiatry

## 2020-10-30 DIAGNOSIS — F3173 Bipolar disorder, in partial remission, most recent episode manic: Secondary | ICD-10-CM | POA: Diagnosis not present

## 2020-10-30 MED ORDER — OLANZAPINE 10 MG PO TABS
10.0000 mg | ORAL_TABLET | Freq: Every day | ORAL | 3 refills | Status: DC
  Filled 2020-10-30 – 2020-11-13 (×2): qty 30, 30d supply, fill #0
  Filled 2021-01-02: qty 30, 30d supply, fill #1
  Filled 2021-01-24: qty 30, 30d supply, fill #0

## 2020-10-30 MED ORDER — BENZTROPINE MESYLATE 0.5 MG PO TABS
ORAL_TABLET | ORAL | 3 refills | Status: DC
  Filled 2020-10-30: qty 60, 30d supply, fill #0
  Filled 2020-12-22 – 2021-01-02 (×2): qty 60, 30d supply, fill #1
  Filled 2021-01-24: qty 60, 30d supply, fill #0

## 2020-10-30 MED ORDER — DIVALPROEX SODIUM 500 MG PO DR TAB
DELAYED_RELEASE_TABLET | ORAL | 3 refills | Status: DC
  Filled 2020-10-30: qty 60, 30d supply, fill #0
  Filled 2021-01-02: qty 60, 30d supply, fill #1
  Filled 2021-01-24: qty 60, 30d supply, fill #0

## 2020-10-30 NOTE — Progress Notes (Signed)
BH MD/PA/NP OP Progress Note Virtual Visit via Telephone Note  I connected with Susan Davis on 10/30/20 at  3:00 PM EDT by telephone and verified that I am speaking with the correct person using two identifiers.  Location: Patient: home Provider: Clinic   I discussed the limitations, risks, security and privacy concerns of performing an evaluation and management service by telephone and the availability of in person appointments. I also discussed with the patient that there may be a patient responsible charge related to this service. The patient expressed understanding and agreed to proceed.   I provided 30 minutes of non-face-to-face time during this encounter.      10/30/2020 3:23 PM Susan Davis  MRN:  [id number]  Chief Complaint: "I have just been taking my medication and praying"   HPI: 25 year old female seen today for follow up psychiatric evaluation.   She has a psychiatric history of bipolar disorder, anxiety, and depression.  She is currently being managed on Cogentin 0.5 mg daily, Zyprexa 10 mg, and Depakote 500 mg twice daily.  She notes that her medications are effective in managing her psychiatric conditions.   Today she was unable to logon virtually so her assessment was done over the phone.  During exam she was pleasant, cooperative, and engaged in conversation.  She informed Clinical research associate that since her last visit she has been taking her medications and praying.  She notes that she likes the reduction of her Zyprexa.  She states I feel that works better than the higher dose.  Provider discussed doing a repeat prolactin levels as patient's last levels were elevated.  She endorsed understanding and agreed.  Order placed for patient to have this done  Patient notes that her anxiety and her depression continues to be minimal.  Provider conducted a GAD-7 and P scored a 8, at her last visit she scored a 1.  Provider also conducted PHQ-9 and patient scored an 4, at her  last visit she scored a 4.  She endorses adequate sleep and notes that her appetite fluctuates.  She denies weight gain/loss.  Today she denies SI/HI/VAH, mania, or paranoia.    Today she is agreeable to do a repeat prolactin levels drawn. No medication changes made today.  Patient agreeable to continue medications as prescribed. No other concerns noted at this time.   Visit Diagnosis:    ICD-10-CM   1. Bipolar disorder, in partial remission, most recent episode manic (HCC)  F31.73 Prolactin    benztropine (COGENTIN) 0.5 MG tablet    divalproex (DEPAKOTE) 500 MG DR tablet    OLANZapine (ZYPREXA) 10 MG tablet      Past Psychiatric History: Depression, Anxiety, and Bipolar affective Past Medical History:  Past Medical History:  Diagnosis Date   Medical history non-contributory    History reviewed. No pertinent surgical history.  Family Psychiatric History: Patient notes that several people in her family suffer from anxiety and depression however she could not give provider specifics of who they were.    Family History: History reviewed. No pertinent family history.  Social History:  Social History   Socioeconomic History   Marital status: Single    Spouse name: Not on file   Number of children: Not on file   Years of education: Not on file   Highest education level: Not on file  Occupational History   Not on file  Tobacco Use   Smoking status: Never   Smokeless tobacco: Never  Vaping Use   Vaping Use:  Never used  Substance and Sexual Activity   Alcohol use: Yes    Comment: "occasionally"    Drug use: Yes    Types: Marijuana   Sexual activity: Not Currently  Other Topics Concern   Not on file  Social History Narrative   Not on file   Social Determinants of Health   Financial Resource Strain: Not on file  Food Insecurity: Not on file  Transportation Needs: Not on file  Physical Activity: Not on file  Stress: Not on file  Social Connections: Not on file     Allergies:  Allergies  Allergen Reactions   Other Other (See Comments)    Allergic to Unknown Anxiety Medication which causes twitching    Metabolic Disorder Labs: No results found for: HGBA1C, MPG Lab Results  Component Value Date   PROLACTIN 52.1 (H) 08/14/2020   Lab Results  Component Value Date   CHOL 149 05/19/2018   TRIG 56 05/19/2018   HDL 57 05/19/2018   CHOLHDL 2.6 05/19/2018   VLDL 11 05/19/2018   LDLCALC 81 05/19/2018   Lab Results  Component Value Date   TSH 1.667 05/19/2018    Therapeutic Level Labs: No results found for: LITHIUM No results found for: VALPROATE No components found for:  CBMZ  Current Medications: Current Outpatient Medications  Medication Sig Dispense Refill   benztropine (COGENTIN) 0.5 MG tablet TAKE 1 TABLET (0.5 MG TOTAL) BY MOUTH 2 (TWO) TIMES DAILY 60 tablet 3   Dextromethorphan-quiNIDine (NUEDEXTA) 20-10 MG capsule Take 1 capsule by mouth 2 (two) times daily. 60 capsule 2   divalproex (DEPAKOTE) 500 MG DR tablet TAKE 1 TABLET (500 MG TOTAL) BY MOUTH IN THE MORNING AND AT BEDTIME 60 tablet 3   loratadine (CLARITIN) 10 MG tablet Take 10 mg by mouth daily.     OLANZapine (ZYPREXA) 10 MG tablet Take 1 tablet (10 mg total) by mouth at bedtime. 30 tablet 3   omega-3 acid ethyl esters (LOVAZA) 1 g capsule Take 1 capsule (1 g total) by mouth 2 (two) times daily. 60 capsule 5   No current facility-administered medications for this visit.     Musculoskeletal: Strength & Muscle Tone:  Unable to assess due to telephone visit Gait & Station:  Unable to assess due to telephone visit Patient leans: N/A  Psychiatric Specialty Exam: Review of Systems  There were no vitals taken for this visit.There is no height or weight on file to calculate BMI.  General Appearance:  Unable to assess due to telephone visit  Eye Contact:   Unable to assess due to telephone visit  Speech:  Clear and Coherent and Normal Rate  Volume:  Normal  Mood:   Euthymic  Affect:  Appropriate and Congruent  Thought Process:  Coherent, Goal Directed and Linear  Orientation:  Full (Time, Place, and Person)  Thought Content: WDL and Logical   Suicidal Thoughts:  No  Homicidal Thoughts:  No  Memory:  Immediate;   Good Recent;   Good Remote;   Good  Judgement:  Good  Insight:  Good  Psychomotor Activity:   Unable to assess due to telephone visit  Concentration:  Concentration: Good and Attention Span: Good  Recall:  Good  Fund of Knowledge: Good  Language: Good  Akathisia:   Unable to assess due to telephone visit  Handed:  Right  AIMS (if indicated): Not done  Assets:  Communication Skills Desire for Improvement Financial Resources/Insurance Housing Social Support  ADL's:  Intact  Cognition: WNL  Sleep:  Good   Screenings: AIMS    Flowsheet Row [facility]  AIMS Total Score 0      AUDIT    Flowsheet Row Ad[facility]  Alcohol Use Disorder Identification Test Final Score (AUDIT) 0      GAD-7    Flowsheet Row Video Visit from 10/30/2020 in [facility] Video Visit from 07/31/2020 in [facility] Video Visit from 05/01/2020 in [facility] Video Visit from 01/31/2020 in [facility]  Total GAD-7 Score 8 1 0 2      PHQ2-9    Flowsheet Row Video Visit from 10/30/2020 in [facility] Video Visit from 07/31/2020 in [facility] Video Visit from 05/01/2020 in [facility] Video Visit from 01/31/2020 in [facility] Office Visit from 11/15/2019 in [facility]  PHQ-2 Total Score 0 1 0 0 0  PHQ-9 Total Score 4 4 2 3  --      Flowsheet Row Video  Visit from 05/01/2020 in [facility] Most recent reading at 05/01/2020 11:16 AM Admission (Discharged) [facility] Most recent reading at 05/18/2018 11:00 PM ED from 05/18/2018 in [facility] Most recent reading at 05/18/2018 11:34 AM  C-SSRS RISK CATEGORY No Risk Moderate Risk Error: Q6 is Yes, you must answer 7        Assessment and Plan: Patient reports that she is doing well on current medication regimen.  Provider discussed doing repeat prolactin levels as patient's last values were elevated.  She was agreeable to this.  No medication changes made today.  Patient agreeable to continue medications as prescribed  1. Bipolar disorder, in partial remission, most recent episode manic (HCC)  - Prolactin Continue- benztropine (COGENTIN) 0.5 MG tablet; TAKE 1 TABLET (0.5 MG TOTAL) BY MOUTH 2 (TWO) TIMES DAILY  Dispense: 60 tablet; Refill: 3 Continue- divalproex (DEPAKOTE) 500 MG DR tablet; TAKE 1 TABLET (500 MG TOTAL) BY MOUTH IN THE MORNING AND AT BEDTIME  Dispense: 60 tablet; Refill: 3 Continue- OLANZapine (ZYPREXA) 10 MG tablet; Take 1 tablet (10 mg total) by mouth at bedtime.  Dispense: 30 tablet; Refill: 3  Follow up in 3 months   07/18/2018, NP 10/30/2020, 3:23 PM

## 2020-10-31 ENCOUNTER — Other Ambulatory Visit: Payer: Self-pay

## 2020-11-07 ENCOUNTER — Other Ambulatory Visit: Payer: Self-pay

## 2020-11-13 ENCOUNTER — Other Ambulatory Visit: Payer: Self-pay

## 2020-12-22 ENCOUNTER — Other Ambulatory Visit: Payer: Self-pay

## 2020-12-29 ENCOUNTER — Other Ambulatory Visit: Payer: Self-pay

## 2021-01-02 ENCOUNTER — Other Ambulatory Visit: Payer: Self-pay

## 2021-01-03 ENCOUNTER — Other Ambulatory Visit: Payer: Self-pay

## 2021-01-10 ENCOUNTER — Other Ambulatory Visit: Payer: Self-pay

## 2021-01-24 ENCOUNTER — Other Ambulatory Visit: Payer: Self-pay

## 2021-01-25 ENCOUNTER — Other Ambulatory Visit: Payer: Self-pay

## 2021-01-26 ENCOUNTER — Other Ambulatory Visit: Payer: Self-pay

## 2021-01-30 ENCOUNTER — Encounter (HOSPITAL_COMMUNITY): Payer: Self-pay | Admitting: Psychiatry

## 2021-01-30 ENCOUNTER — Telehealth (INDEPENDENT_AMBULATORY_CARE_PROVIDER_SITE_OTHER): Payer: No Payment, Other | Admitting: Psychiatry

## 2021-01-30 ENCOUNTER — Other Ambulatory Visit: Payer: Self-pay

## 2021-01-30 DIAGNOSIS — F3173 Bipolar disorder, in partial remission, most recent episode manic: Secondary | ICD-10-CM | POA: Diagnosis not present

## 2021-01-30 MED ORDER — BENZTROPINE MESYLATE 0.5 MG PO TABS
ORAL_TABLET | ORAL | 3 refills | Status: DC
Start: 2021-01-30 — End: 2021-07-05
  Filled 2021-01-30: qty 60, fill #0
  Filled 2021-03-12: qty 60, 30d supply, fill #0
  Filled 2021-04-24: qty 60, 30d supply, fill #1
  Filled 2021-06-04: qty 60, 30d supply, fill #2

## 2021-01-30 MED ORDER — OLANZAPINE 10 MG PO TABS
10.0000 mg | ORAL_TABLET | Freq: Every day | ORAL | 3 refills | Status: DC
  Filled 2021-01-30 – 2021-03-12 (×2): qty 30, 30d supply, fill #0
  Filled 2021-04-24: qty 30, 30d supply, fill #1
  Filled 2021-06-04: qty 30, 30d supply, fill #2

## 2021-01-30 MED ORDER — DIVALPROEX SODIUM 500 MG PO DR TAB
DELAYED_RELEASE_TABLET | ORAL | 3 refills | Status: DC
  Filled 2021-01-30: qty 60, fill #0
  Filled 2021-03-12: qty 60, 30d supply, fill #0
  Filled 2021-04-24: qty 60, 30d supply, fill #1
  Filled 2021-06-04: qty 60, 30d supply, fill #2

## 2021-01-30 NOTE — Progress Notes (Signed)
[place] MD/PA/NP OP Progress Note Virtual Visit via Telephone Note  I connected with Susan Davis on [id number] at  4:00 PM EST by telephone and verified that I am speaking with the correct person using two identifiers.  Location: Patient: home Provider: Clinic   I discussed the limitations, risks, security and privacy concerns of performing an evaluation and management service by telephone and the availability of in person appointments. I also discussed with the patient that there may be a patient responsible charge related to this service. The patient expressed understanding and agreed to proceed.   I provided 30 minutes of non-face-to-face time during this encounter.      [id number] AM Susan Davis  MRN:  [id number]  Chief Complaint: "I'm dealing with a break up"   HPI: 26 year old female seen today for follow up psychiatric evaluation.   She has a psychiatric history of bipolar disorder, anxiety, and depression.  She is currently being managed on Cogentin 0.5 mg daily, Zyprexa 10 mg, and Depakote 500 mg twice daily.  She notes that her medications are effective in managing her psychiatric conditions.   Today she was unable to logon virtually so her assessment was done over the phone.  During exam she was pleasant, cooperative, and engaged in conversation.  She informed Probation officer that she is dealing with a break up with her boyfriend. She notes she is still trying to push forward. She notes that she is trying pay off fines, enroll into school for medical assistance, and please God. She reports that she is saddened by her break up but notes that she does not want to settle in life. She informed Probation officer that she has been more anxious in social settings but reports that she prays to calm herself down. Today provider conducted a GAD 7 and patient scored a 16, at her last visit she scored an 8. Provider also conducted a PHQ 9 and patient scored a 9, at her last visit she scored a 4.  She endorses decreased appetite and reports losing ten pounds. She notes her sleep is adequate and denies SI/HI/VAH, mania, or paranoia.    No medication changes made today.  Patient agreeable to continue medications as prescribed. No other concerns noted at this time.   Visit Diagnosis:    ICD-10-CM   1. Bipolar disorder, in partial remission, most recent episode manic (HCC)  F31.73 benztropine (COGENTIN) 0.5 MG tablet    divalproex (DEPAKOTE) 500 MG DR tablet    OLANZapine (ZYPREXA) 10 MG tablet      Past Psychiatric History: Depression, Anxiety, and Bipolar affective Past Medical History:  Past Medical History:  Diagnosis Date   Medical history non-contributory    History reviewed. No pertinent surgical history.  Family Psychiatric History: Patient notes that several people in her family suffer from anxiety and depression however she could not give provider specifics of who they were.    Family History: History reviewed. No pertinent family history.  Social History:  Social History   Socioeconomic History   Marital status: Single    Spouse name: Not on file   Number of children: Not on file   Years of education: Not on file   Highest education level: Not on file  Occupational History   Not on file  Tobacco Use   Smoking status: Never   Smokeless tobacco: Never  Vaping Use   Vaping Use: Never used  Substance and Sexual Activity   Alcohol use: Yes    Comment: "occasionally"  Drug use: Yes    Types: Marijuana   Sexual activity: Not Currently  Other Topics Concern   Not on file  Social History Narrative   Not on file   Social Determinants of Health   Financial Resource Strain: Not on file  Food Insecurity: Not on file  Transportation Needs: Not on file  Physical Activity: Not on file  Stress: Not on file  Social Connections: Not on file    Allergies:  Allergies  Allergen Reactions   Other Other (See Comments)    Allergic to Unknown Anxiety Medication  which causes twitching    Metabolic Disorder Labs: No results found for: HGBA1C, MPG Lab Results  Component Value Date   PROLACTIN 52.1 (H) 08/14/2020   Lab Results  Component Value Date   CHOL 149 05/19/2018   TRIG 56 05/19/2018   HDL 57 05/19/2018   CHOLHDL 2.6 05/19/2018   VLDL 11 05/19/2018   Pace 81 05/19/2018   Lab Results  Component Value Date   TSH 1.667 05/19/2018    Therapeutic Level Labs: No results found for: LITHIUM No results found for: VALPROATE No components found for:  CBMZ  Current Medications: Current Outpatient Medications  Medication Sig Dispense Refill   benztropine (COGENTIN) 0.5 MG tablet TAKE 1 TABLET (0.5 MG TOTAL) BY MOUTH 2 (TWO) TIMES DAILY 60 tablet 3   Dextromethorphan-quiNIDine (NUEDEXTA) 20-10 MG capsule Take 1 capsule by mouth 2 (two) times daily. 60 capsule 2   divalproex (DEPAKOTE) 500 MG DR tablet TAKE 1 TABLET (500 MG TOTAL) BY MOUTH IN THE MORNING AND AT BEDTIME 60 tablet 3   loratadine (CLARITIN) 10 MG tablet Take 10 mg by mouth daily.     OLANZapine (ZYPREXA) 10 MG tablet Take 1 tablet (10 mg total) by mouth at bedtime. 30 tablet 3   omega-3 acid ethyl esters (LOVAZA) 1 g capsule Take 1 capsule (1 g total) by mouth 2 (two) times daily. 60 capsule 5   No current facility-administered medications for this visit.     Musculoskeletal: Strength & Muscle Tone:  Unable to assess due to telephone visit Gait & Station:  Unable to assess due to telephone visit Patient leans: N/A  Psychiatric Specialty Exam: Review of Systems  There were no vitals taken for this visit.There is no height or weight on file to calculate BMI.  General Appearance:  Unable to assess due to telephone visit  Eye Contact:   Unable to assess due to telephone visit  Speech:  Clear and Coherent and Normal Rate  Volume:  Normal  Mood:  Euthymic, reports depression/ anxiety situational  Affect:  Appropriate and Congruent  Thought Process:  Coherent, Goal  Directed and Linear  Orientation:  Full (Time, Place, and Person)  Thought Content: WDL and Logical   Suicidal Thoughts:  No  Homicidal Thoughts:  No  Memory:  Immediate;   Good Recent;   Good Remote;   Good  Judgement:  Good  Insight:  Good  Psychomotor Activity:   Unable to assess due to telephone visit  Concentration:  Concentration: Good and Attention Span: Good  Recall:  Good  Fund of Knowledge: Good  Language: Good  Akathisia:   Unable to assess due to telephone visit  Handed:  Right  AIMS (if indicated): Not done  Assets:  Communication Skills Desire for Improvement Financial Resources/Insurance Housing Social Support  ADL's:  Intact  Cognition: WNL  Sleep:  Good   Screenings: [place] Admission (Discharged) from  05/18/2018 in [place] [facility]  AIMS Total Score 0      AUDIT    Flowsheet Row Admission (Discharged) from 05/18/2018 in [place] [facility]  Alcohol Use Disorder Identification Test Final Score (AUDIT) 0      GAD-7    Flowsheet Row Video Visit from 01/30/2021 in [facility] Video Visit from 10/30/2020 in [facility] Video Visit from 07/31/2020 in [facility] Video Visit from 05/01/2020 in [facility] Video Visit from 01/31/2020 in [facility]  Total GAD-7 Score 16 8 1  0 2      PHQ2-9    Flowsheet Row Video Visit from 01/30/2021 in [facility] Video Visit from 10/30/2020 in [facility] Video Visit from 07/31/2020 in [facility] Video Visit from 05/01/2020 in [facility] Video Visit from 01/31/2020 in [place]  PHQ-2 Total Score 2 0 1 0 0  PHQ-9 Total Score 9 4 4 2 3       Flowsheet Row Video Visit from  05/01/2020 in [facility] Most recent reading at 05/01/2020 11:16 AM Admission (Discharged) from 05/18/2018 in [facility] [place] [facility] Most recent reading at 05/18/2018 11:00 PM ED from 05/18/2018 in [place] [facility] Most recent reading at 05/18/2018 11:34 AM  C-SSRS RISK CATEGORY No Risk Moderate Risk Error: Q6 is Yes, you must answer 7        Assessment and Plan: Patient reports that she is doing well on current medication regimen.  No medication changes made today.  Patient agreeable to continue medications as prescribed  1. Bipolar disorder, in partial remission, most recent episode manic (HCC)  Continue- benztropine (COGENTIN) 0.5 MG tablet; TAKE 1 TABLET (0.5 MG TOTAL) BY MOUTH 2 (TWO) TIMES DAILY  Dispense: 60 tablet; Refill: 3 Continue- divalproex (DEPAKOTE) 500 MG DR tablet; TAKE 1 TABLET (500 MG TOTAL) BY MOUTH IN THE MORNING AND AT BEDTIME  Dispense: 60 tablet; Refill: 3 Continue- OLANZapine (ZYPREXA) 10 MG tablet; Take 1 tablet (10 mg total) by mouth at bedtime.  Dispense: 30 tablet; Refill: 3  Follow up in 3 months   Salley Slaughter, NP 01/30/2021, 9:49 AM

## 2021-03-12 ENCOUNTER — Other Ambulatory Visit: Payer: Self-pay
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
## 2021-04-12 ENCOUNTER — Telehealth (INDEPENDENT_AMBULATORY_CARE_PROVIDER_SITE_OTHER): Payer: No Payment, Other | Admitting: Psychiatry

## 2021-04-12 DIAGNOSIS — F3173 Bipolar disorder, in partial remission, most recent episode manic: Secondary | ICD-10-CM | POA: Diagnosis not present

## 2021-04-12 NOTE — Progress Notes (Signed)
BH MD/PA/NP OP Progress Note ? ?0000000 [id number] PM ?[name]  ?MRN:  [id number] ?Virtual Visit via Video Note ? ?I connected with Susan Davis on [id number] at  1:30 PM EDT by a video enabled telemedicine application and verified that I am speaking with the correct person using two identifiers. ? ?Location: ?Patient: Home ?Provider: Offsite ?  ?I discussed the limitations of evaluation and management by telemedicine and the availability of in person appointments. The patient expressed understanding and agreed to proceed. ? ?  ?I discussed the assessment and treatment plan with the patient. The patient was provided an opportunity to ask questions and all were answered. The patient agreed with the plan and demonstrated an understanding of the instructions. ?  ?The patient was advised to call back or seek an in-person evaluation if the symptoms worsen or if the condition fails to improve as anticipated. ? ?I provided 10 minutes of non-face-to-face time during this encounter. ? ? ?Franne Grip, NP  ?Chief Complaint: Medication management ? ?HPI: Susan Davis is a 26 year old female presenting to [facility] behavioral health outpatient for a follow-up psychiatric evaluation.  She has a psychiatric history of bipolar disorder, anxiety and depression.  Her symptoms are managed with Cogentin 0.5 mg daily, Zyprexa 10 mg daily, Depakote 500 mg twice daily.  She reports that her medications are effective with managing her symptoms and states that she is medication compliant.  Patient noted with 3 refills at the pharmacy since her last visit with her attending psychiatric provider.  Patient reports that she has not picked up her medications from the pharmacy due to lack of transportation, but states that she has an adequate supply at home.  Patient denies need for refills on her medication today.  She denies adverse effects or the need for dosage adjustment today.  No medication changes today. ?Patient is  alert and oriented x4, calm, pleasant and willing to engage.  She appears dressed appropriately for the weather and is well-groomed.  Patient denies suicidal or homicidal ideations, paranoia, delusional thought, auditory or visual hallucinations.  She reports a good mood, appetite and sleep cycle. ? ?Visit Diagnosis:  ?  ICD-10-CM   ?1. Bipolar disorder, in partial remission, most recent episode manic ([place])  F31.73   ?  ? ? ?Past Psychiatric History: Bipolar disorder, anxiety and depression ? ?Past Medical History:  ?Past Medical History:  ?Diagnosis Date  ? Medical history non-contributory   ? No past surgical history on file. ? ?Family Psychiatric History: None known ? ?Family History: No family history on file. ? ?Social History:  ?Social History  ? ?Socioeconomic History  ? Marital status: Single  ?  Spouse name: Not on file  ? Number of children: Not on file  ? Years of education: Not on file  ? Highest education level: Not on file  ?Occupational History  ? Not on file  ?Tobacco Use  ? Smoking status: Never  ? Smokeless tobacco: Never  ?Vaping Use  ? Vaping Use: Never used  ?Substance and Sexual Activity  ? Alcohol use: Yes  ?  Comment: "occasionally"   ? Drug use: Yes  ?  Types: Marijuana  ? Sexual activity: Not Currently  ?Other Topics Concern  ? Not on file  ?Social History Narrative  ? Not on file  ? ?Social Determinants of Health  ? ?Financial Resource Strain: Not on file  ?Food Insecurity: Not on file  ?Transportation Needs: Not on file  ?Physical Activity: Not on file  ?  Stress: Not on file  ?Social Connections: Not on file  ? ? ?Allergies:  ?Allergies  ?Allergen Reactions  ? Other Other (See Comments)  ?  Allergic to Unknown Anxiety Medication which causes twitching  ? ? ?Metabolic Disorder Labs: ?No results found for: HGBA1C, MPG ?Lab Results  ?Component Value Date  ? PROLACTIN 52.1 (H) 08/14/2020  ? ?Lab Results  ?Component Value Date  ? CHOL 149 05/19/2018  ? TRIG 56 05/19/2018  ? HDL 57 05/19/2018  ?  CHOLHDL 2.6 05/19/2018  ? VLDL 11 05/19/2018  ? [place] 81 05/19/2018  ? ?Lab Results  ?Component Value Date  ? TSH 1.667 05/19/2018  ? ? ?Therapeutic Level Labs: ?No results found for: LITHIUM ?No results found for: VALPROATE ?No components found for:  CBMZ ? ?Current Medications: ?Current Outpatient Medications  ?Medication Sig Dispense Refill  ? benztropine (COGENTIN) 0.5 MG tablet TAKE 1 TABLET (0.5 MG TOTAL) BY MOUTH 2 (TWO) TIMES DAILY 60 tablet 3  ? Dextromethorphan-quiNIDine (NUEDEXTA) 20-10 MG capsule Take 1 capsule by mouth 2 (two) times daily. 60 capsule 2  ? divalproex (DEPAKOTE) 500 MG DR tablet TAKE 1 TABLET (500 MG TOTAL) BY MOUTH IN THE MORNING AND AT BEDTIME 60 tablet 3  ? loratadine (CLARITIN) 10 MG tablet Take 10 mg by mouth daily.    ? OLANZapine (ZYPREXA) 10 MG tablet Take 1 tablet (10 mg total) by mouth at bedtime. 30 tablet 3  ? omega-3 acid ethyl esters (LOVAZA) 1 g capsule Take 1 capsule (1 g total) by mouth 2 (two) times daily. 60 capsule 5  ? ?No current facility-administered medications for this visit.  ? ? ? ?Musculoskeletal: ?Strength & Muscle Tone: N/A virtual visit ?Gait & Station: N/A virtual visit ?Patient leans: N/A ? ?Psychiatric Specialty Exam: ?Review of Systems  ?Psychiatric/Behavioral:  Negative for hallucinations, self-injury and suicidal ideas. The patient is not hyperactive.   ?All other systems reviewed and are negative.  ?There were no vitals taken for this visit.There is no height or weight on file to calculate BMI.  ?General Appearance: Well-groomed  ?Eye Contact:  Good  ?Speech:  Clear and Coherent  ?Volume:  Normal  ?Mood:  Euthymic  ?Affect:  Congruent  ?Thought Process:  Coherent  ?Orientation:  Full (Time, Place, and Person)  ?Thought Content: Logical   ?Suicidal Thoughts:  No  ?Homicidal Thoughts:  No  ?Memory: Good  ?Judgement:  Good  ?Insight:  Good  ?Psychomotor Activity:  NA  ?Concentration: Good  ?Recall:  Good  ?Fund of Knowledge: Good  ?Language: Good   ?Akathisia:  NA  ?Handed:  Right  ?AIMS (if indicated): not done  ?Assets:  Communication Skills ?Desire for Improvement  ?ADL's:  Intact  ?Cognition: WNL  ?Sleep:  Good  ? ?Screenings: ?AIMS   ? ?Flowsheet Row Admission (Discharged) from 05/18/2018 in [place] [facility]  ?AIMS Total Score 0  ? ?  ? ?AUDIT   ? ?Flowsheet Row Admission (Discharged) from 05/18/2018 in [place] [facility]  ?Alcohol Use Disorder Identification Test Final Score (AUDIT) 0  ? ?  ? ?GAD-7   ? ?Flowsheet Row Video Visit from 01/30/2021 in [facility] Video Visit from 10/30/2020 in [facility] Video Visit from 07/31/2020 in [facility] Video Visit from 05/01/2020 in [facility] Video Visit from 01/31/2020 in [facility]  ?Total GAD-7 Score 16 8 1  0 2  ? ?  ? ?  PHQ2-9   ? ?Flowsheet Row Video Visit from 01/30/2021 in [facility] Video Visit from 10/30/2020 in [facility] Video Visit from 07/31/2020 in [facility] Video Visit from 05/01/2020 in [facility] Video Visit from 01/31/2020 in [facility]  ?PHQ-2 Total Score 2 0 1 0 0  ?PHQ-9 Total Score 9 4 4 2 3   ? ?  ? ?Flowsheet Row Video Visit from 05/01/2020 in [facility] ?Most recent reading at 05/01/2020 11:16 AM Admission (Discharged) from 05/18/2018 in [place] [facility] ?Most recent reading at 05/18/2018 11:00 PM ED from 05/18/2018 in [place] DEPT ?Most recent reading at 05/18/2018 11:34 AM  ?C-SSRS RISK CATEGORY No Risk Moderate Risk Error: Q6 is Yes, you must answer 7  ? ?  ? ? ? ?Assessment and Plan: Maleeyah Glaab is a 26 year old female presenting to [facility] behavioral health  outpatient for a follow-up psychiatric evaluation.  She has a psychiatric history of bipolar disorder, anxiety and depression.  Her symptoms are managed with Cogentin 0.5 mg twice daily, Zyprexa 10 mg dai

## 2021-04-24 ENCOUNTER — Other Ambulatory Visit: Payer: Self-pay

## 2021-04-25 ENCOUNTER — Other Ambulatory Visit: Payer: Self-pay

## 2021-06-04 ENCOUNTER — Other Ambulatory Visit: Payer: Self-pay

## 2021-07-05 ENCOUNTER — Encounter (HOSPITAL_COMMUNITY): Payer: Self-pay | Admitting: Psychiatry

## 2021-07-05 ENCOUNTER — Other Ambulatory Visit: Payer: Self-pay

## 2021-07-05 ENCOUNTER — Telehealth (INDEPENDENT_AMBULATORY_CARE_PROVIDER_SITE_OTHER): Payer: No Payment, Other | Admitting: Psychiatry

## 2021-07-05 DIAGNOSIS — F3173 Bipolar disorder, in partial remission, most recent episode manic: Secondary | ICD-10-CM | POA: Diagnosis not present

## 2021-07-05 MED ORDER — BENZTROPINE MESYLATE 0.5 MG PO TABS
ORAL_TABLET | ORAL | 3 refills | Status: DC
  Filled 2021-07-05: qty 60, 30d supply, fill #0
  Filled 2021-08-16: qty 60, 30d supply, fill #1
  Filled 2021-09-21: qty 60, 30d supply, fill #2

## 2021-07-05 MED ORDER — DIVALPROEX SODIUM 500 MG PO DR TAB
DELAYED_RELEASE_TABLET | ORAL | 3 refills | Status: DC
  Filled 2021-07-05: qty 60, 30d supply, fill #0
  Filled 2021-08-16: qty 60, 30d supply, fill #1
  Filled 2021-09-21: qty 60, 30d supply, fill #2

## 2021-07-05 MED ORDER — OLANZAPINE 10 MG PO TABS
10.0000 mg | ORAL_TABLET | Freq: Every day | ORAL | 3 refills | Status: DC
  Filled 2021-07-05: qty 30, 30d supply, fill #0
  Filled 2021-08-16: qty 30, 30d supply, fill #1
  Filled 2021-09-21: qty 30, 30d supply, fill #2

## 2021-07-05 NOTE — Progress Notes (Signed)
BH MD/PA/NP OP Progress Note. Virtual Visit via Video Note  I connected with Susan Davis on 07/05/21 at  2:30 PM EDT by a video enabled telemedicine application and verified that I am speaking with the correct person using two identifiers.  Location: Patient: Home Provider: Clinic   I discussed the limitations of evaluation and management by telemedicine and the availability of in person appointments. The patient expressed understanding and agreed to proceed.  I provided 30 minutes of non-face-to-face time during this encounter.        07/05/2021 2:33 PM Susan Davis  MRN:  [id number]  Chief Complaint: "I'm doing good"   HPI: 26 year old female seen today for follow up psychiatric evaluation.   She has a psychiatric history of bipolar disorder, anxiety, and depression.  She is currently being managed on Cogentin 0.5 mg daily, Zyprexa 10 mg, and Depakote 500 mg twice daily.  She notes that her medications are effective in managing her psychiatric conditions.   Today she was well-groomed, pleasant, cooperative, engaged in conversation, and maintained eye contact.  She informed Clinical research associate that she has been doing good.  She notes that she got back together with her boyfriend and things are working out well.  She informed Clinical research associate that her mood is stable and notes that she has minimal anxiety and depression.  Provider conducted a GAD-7 and patient scored a 4.  Provider also conducted PHQ-9 and patient scored a 2.  She endorses adequate sleep and appetite.  Today she denies SI/HI/AVH, mania, paranoia.     Patient notes that she has not had labs drawn in a while.  Provider ordered CBC, thyroid panel, Depakote levels, and liver function tests.  No medication changes made today. Patient agreeable to continue medications as prescribed. No other concerns noted at this time.   Visit Diagnosis:    ICD-10-CM   1. Bipolar disorder, in partial remission, most recent episode manic (HCC)   F31.73 CBC w/Diff/Platelet    Hepatic function panel    Thyroid Panel With TSH    Valproic acid level    OLANZapine (ZYPREXA) 10 MG tablet    divalproex (DEPAKOTE) 500 MG DR tablet    benztropine (COGENTIN) 0.5 MG tablet      Past Psychiatric History: Depression, Anxiety, and Bipolar affective Past Medical History:  Past Medical History:  Diagnosis Date   Medical history non-contributory    History reviewed. No pertinent surgical history.  Family Psychiatric History: Patient notes that several people in her family suffer from anxiety and depression however she could not give provider specifics of who they were.    Family History: History reviewed. No pertinent family history.  Social History:  Social History   Socioeconomic History   Marital status: Single    Spouse name: Not on file   Number of children: Not on file   Years of education: Not on file   Highest education level: Not on file  Occupational History   Not on file  Tobacco Use   Smoking status: Never   Smokeless tobacco: Never  Vaping Use   Vaping Use: Never used  Substance and Sexual Activity   Alcohol use: Yes    Comment: "occasionally"    Drug use: Yes    Types: Marijuana   Sexual activity: Not Currently  Other Topics Concern   Not on file  Social History Narrative   Not on file   Social Determinants of Health   Financial Resource Strain: Not on file  Food Insecurity:  Not on file  Transportation Needs: Not on file  Physical Activity: Not on file  Stress: Not on file  Social Connections: Not on file    Allergies:  Allergies  Allergen Reactions   Other Other (See Comments)    Allergic to Unknown Anxiety Medication which causes twitching    Metabolic Disorder Labs: No results found for: "HGBA1C", "MPG" Lab Results  Component Value Date   PROLACTIN 52.1 (H) 08/14/2020   Lab Results  Component Value Date   CHOL 149 05/19/2018   TRIG 56 05/19/2018   HDL 57 05/19/2018   CHOLHDL 2.6  05/19/2018   VLDL 11 05/19/2018   LDLCALC 81 05/19/2018   Lab Results  Component Value Date   TSH 1.667 05/19/2018    Therapeutic Level Labs: No results found for: "LITHIUM" No results found for: "VALPROATE" No results found for: "CBMZ"  Current Medications: Current Outpatient Medications  Medication Sig Dispense Refill   benztropine (COGENTIN) 0.5 MG tablet TAKE 1 TABLET (0.5 MG TOTAL) BY MOUTH 2 (TWO) TIMES DAILY 60 tablet 3   Dextromethorphan-quiNIDine (NUEDEXTA) 20-10 MG capsule Take 1 capsule by mouth 2 (two) times daily. 60 capsule 2   divalproex (DEPAKOTE) 500 MG DR tablet TAKE 1 TABLET (500 MG TOTAL) BY MOUTH IN THE MORNING AND AT BEDTIME 60 tablet 3   loratadine (CLARITIN) 10 MG tablet Take 10 mg by mouth daily.     OLANZapine (ZYPREXA) 10 MG tablet Take 1 tablet (10 mg total) by mouth at bedtime. 30 tablet 3   omega-3 acid ethyl esters (LOVAZA) 1 g capsule Take 1 capsule (1 g total) by mouth 2 (two) times daily. 60 capsule 5   No current facility-administered medications for this visit.     Musculoskeletal: Strength & Muscle Tone: within normal limits and  telehealth visit Gait & Station: normal,  telehealth visit Patient leans: N/A  Psychiatric Specialty Exam: Review of Systems  There were no vitals taken for this visit.There is no height or weight on file to calculate BMI.  General Appearance: Well Groomed  Eye Contact:  Good  Speech:  Clear and Coherent and Normal Rate  Volume:  Normal  Mood:  Euthymic,   Affect:  Appropriate and Congruent  Thought Process:  Coherent, Goal Directed and Linear  Orientation:  Full (Time, Place, and Person)  Thought Content: WDL and Logical   Suicidal Thoughts:  No  Homicidal Thoughts:  No  Memory:  Immediate;   Good Recent;   Good Remote;   Good  Judgement:  Good  Insight:  Good  Psychomotor Activity:  Normal  Concentration:  Concentration: Good and Attention Span: Good  Recall:  Good  Fund of Knowledge: Good   Language: Good  Akathisia:  No  Handed:  Right  AIMS (if indicated): Not done  Assets:  Communication Skills Desire for Improvement Financial Resources/Insurance Housing Social Support  ADL's:  Intact  Cognition: WNL  Sleep:  Good   Screenings: AIMS    Flowsheet Row [facility]  AIMS Total Score 0      AUDIT    Flowsheet Row [facility]  Alcohol Use Disorder Identification Test Final Score (AUDIT) 0      GAD-7    Flowsheet Row Video Visit from 07/05/2021 in [facility] Video Visit from 01/30/2021 in [facility] Video Visit from 10/30/2020 [facility] Video Visit from 07/31/2020 in [facility] Video Visit from 05/01/2020 in [facility]  Total GAD-7 Score 4 16 8 1  0      PHQ2-9    Flowsheet Row Video Visit from 07/05/2021 in [facility] Video Visit from 01/30/2021 in [facility] Video Visit from 10/30/2020 in [facility] Video Visit from 07/31/2020 in [facility] Video Visit from 05/01/2020 in [facility]  PHQ-2 Total Score 0 2 0 1 0  PHQ-9 Total Score 2 9 4 4 2       Flowsheet Row Video Visit from 05/01/2020 in [facility] Most recent reading at 05/01/2020 11:16 AM [facility] Most recent reading at 05/18/2018 11:00 PM ED from 05/18/2018 in [facility] Most recent reading at 05/18/2018 11:34 AM  C-SSRS RISK CATEGORY No Risk Moderate Risk Error: Q6 is Yes, you must answer 7        Assessment and Plan: Patient  reports that she is doing well on current medication regimen.  She however reports that she has not had labs done in a while.   Provider ordered CBC, thyroid panel, Depakote levels, and liver function tests.  No medication changes made today. Patient agreeable to continue medications as prescribed   1. Bipolar disorder, in partial remission, most recent episode manic (HCC)  - CBC w/Diff/Platelet - Hepatic function panel - Thyroid Panel With TSH - Valproic acid level Continue- OLANZapine (ZYPREXA) 10 MG tablet; Take 1 tablet (10 mg total) by mouth at bedtime.  Dispense: 30 tablet; Refill: 3 Continue- divalproex (DEPAKOTE) 500 MG DR tablet; TAKE 1 TABLET (500 MG TOTAL) BY MOUTH IN THE MORNING AND AT BEDTIME  Dispense: 60 tablet; Refill: 3 Continue- benztropine (COGENTIN) 0.5 MG tablet; TAKE 1 TABLET (0.5 MG TOTAL) BY MOUTH 2 (TWO) TIMES DAILY  Dispense: 60 tablet; Refill: 3  Follow up in 3 months   07/18/2018, NP 07/05/2021, 2:33 PM

## 2021-07-09 ENCOUNTER — Other Ambulatory Visit: Payer: Self-pay

## 2021-08-16 ENCOUNTER — Other Ambulatory Visit: Payer: Self-pay

## 2021-09-21 ENCOUNTER — Other Ambulatory Visit: Payer: Self-pay

## 2021-09-24 ENCOUNTER — Other Ambulatory Visit: Payer: Self-pay

## 2021-09-24 ENCOUNTER — Other Ambulatory Visit (HOSPITAL_COMMUNITY): Payer: Self-pay | Admitting: Psychiatry

## 2021-09-24 DIAGNOSIS — F3173 Bipolar disorder, in partial remission, most recent episode manic: Secondary | ICD-10-CM

## 2021-09-27 LAB — HEPATIC FUNCTION PANEL
ALT: 21 IU/L (ref 0–32)
AST: 19 IU/L (ref 0–40)
Albumin: 4.4 g/dL (ref 4.0–5.0)
Alkaline Phosphatase: 85 IU/L (ref 44–121)
Bilirubin Total: 0.5 mg/dL (ref 0.0–1.2)
Bilirubin, Direct: 0.14 mg/dL (ref 0.00–0.40)
Total Protein: 7.6 g/dL (ref 6.0–8.5)

## 2021-09-27 LAB — CBC WITH DIFFERENTIAL/PLATELET
Basophils Absolute: 0 10*3/uL (ref 0.0–0.2)
Basos: 1 %
EOS (ABSOLUTE): 0.1 10*3/uL (ref 0.0–0.4)
Eos: 1 %
Hematocrit: 40.7 % (ref 34.0–46.6)
Hemoglobin: 13.7 g/dL (ref 11.1–15.9)
Immature Grans (Abs): 0 10*3/uL (ref 0.0–0.1)
Immature Granulocytes: 0 %
Lymphocytes Absolute: 3.3 10*3/uL — ABNORMAL HIGH (ref 0.7–3.1)
Lymphs: 47 %
MCH: 30.4 pg (ref 26.6–33.0)
MCHC: 33.7 g/dL (ref 31.5–35.7)
MCV: 90 fL (ref 79–97)
Monocytes Absolute: 0.6 10*3/uL (ref 0.1–0.9)
Monocytes: 8 %
Neutrophils Absolute: 3 10*3/uL (ref 1.4–7.0)
Neutrophils: 43 %
Platelets: 285 10*3/uL (ref 150–450)
RBC: 4.51 x10E6/uL (ref 3.77–5.28)
RDW: 12.8 % (ref 11.7–15.4)
WBC: 7.1 10*3/uL (ref 3.4–10.8)

## 2021-09-27 LAB — VALPROIC ACID LEVEL: Valproic Acid Lvl: 29 ug/mL — ABNORMAL LOW (ref 50–100)

## 2021-09-27 LAB — SPECIMEN STATUS REPORT

## 2021-09-27 LAB — TSH: TSH: 1.43 u[IU]/mL (ref 0.450–4.500)

## 2021-09-27 LAB — PROLACTIN: Prolactin: 23 ng/mL (ref 4.8–23.3)

## 2021-10-09 ENCOUNTER — Encounter (HOSPITAL_COMMUNITY): Payer: Self-pay | Admitting: Psychiatry

## 2021-10-09 ENCOUNTER — Other Ambulatory Visit: Payer: Self-pay

## 2021-10-09 ENCOUNTER — Telehealth (INDEPENDENT_AMBULATORY_CARE_PROVIDER_SITE_OTHER): Payer: No Payment, Other | Admitting: Psychiatry

## 2021-10-09 DIAGNOSIS — F3173 Bipolar disorder, in partial remission, most recent episode manic: Secondary | ICD-10-CM | POA: Diagnosis not present

## 2021-10-09 MED ORDER — BENZTROPINE MESYLATE 0.5 MG PO TABS
0.5000 mg | ORAL_TABLET | Freq: Two times a day (BID) | ORAL | 3 refills | Status: DC
  Filled 2021-10-09: qty 60, fill #0
  Filled 2021-10-29: qty 60, 30d supply, fill #0

## 2021-10-09 MED ORDER — OLANZAPINE 10 MG PO TABS
10.0000 mg | ORAL_TABLET | Freq: Every day | ORAL | 3 refills | Status: DC
  Filled 2021-10-09 – 2021-10-29 (×2): qty 30, 30d supply, fill #0

## 2021-10-09 MED ORDER — DIVALPROEX SODIUM 500 MG PO DR TAB
500.0000 mg | DELAYED_RELEASE_TABLET | ORAL | 3 refills | Status: DC
  Filled 2021-10-09: qty 60, fill #0
  Filled 2021-10-29: qty 60, 30d supply, fill #0

## 2021-10-09 NOTE — Progress Notes (Signed)
BH MD/PA/NP OP Progress Note. Virtual Visit via Telephone Note  I connected with Susan Davis on [id number] at  3:00 PM EDT by telephone and verified that I am speaking with the correct person using two identifiers.  Location: Patient: home Provider: Clinic   I discussed the limitations, risks, security and privacy concerns of performing an evaluation and management service by telephone and the availability of in person appointments. I also discussed with the patient that there may be a patient responsible charge related to this service. The patient expressed understanding and agreed to proceed.   I provided 30 minutes of non-face-to-face time during this encounter.        [id number] PM Susan Davis  MRN:  [id number]  Chief Complaint: "I am doing great.  I pray to cope when I need to"   HPI: 26 year old female seen today for follow up psychiatric evaluation.   She has a psychiatric history of bipolar disorder, anxiety, and depression.  She is currently being managed on Cogentin 0.5 mg daily, Zyprexa 10 mg, and Depakote 500 mg twice daily.  She notes that her medications are effective in managing her psychiatric conditions.   Today she was unable to login virtually so assessment is done over the phone.  During exam she was pleasant, cooperative, and engaged in conversation.  She informed Probation officer that she is doing great.  She notes that when stressors arise she prays and speak to family members to cope.  Patient informed writer that her mood is stable and endorses minimal anxiety and depression.  Today provider conducted a GAD-7 and patient scored a 9, at her last visit she scored a 4.  Provider also conducted PHQ-9 and patient scored an 11, at her last visit she scored a 2.  She endorses adequate sleep and appetite.  Today she denies SI/HI/VAH, mania, or paranoia.    Provider discussed recent lab results with patient.  Provider informed patient that her Depakote level  was low.  She informed Probation officer that recently she got a new job and she has been working nights.  She informed Probation officer that at times she forgets to take her medications however notes that she will be more cognizant of taking them.     No medication changes made today. Patient agreeable to continue medications as prescribed. No other concerns noted at this time.   Visit Diagnosis:    ICD-10-CM   1. Bipolar disorder, in partial remission, most recent episode manic (HCC)  F31.73 benztropine (COGENTIN) 0.5 MG tablet    divalproex (DEPAKOTE) 500 MG DR tablet    OLANZapine (ZYPREXA) 10 MG tablet      Past Psychiatric History: Depression, Anxiety, and Bipolar affective Past Medical History:  Past Medical History:  Diagnosis Date   Medical history non-contributory    No past surgical history on file.  Family Psychiatric History: Patient notes that several people in her family suffer from anxiety and depression however she could not give provider specifics of who they were.    Family History: No family history on file.  Social History:  Social History   Socioeconomic History   Marital status: Single    Spouse name: Not on file   Number of children: Not on file   Years of education: Not on file   Highest education level: Not on file  Occupational History   Not on file  Tobacco Use   Smoking status: Never   Smokeless tobacco: Never  Vaping Use   Vaping Use:  Never used  Substance and Sexual Activity   Alcohol use: Yes    Comment: "occasionally"    Drug use: Yes    Types: Marijuana   Sexual activity: Not Currently  Other Topics Concern   Not on file  Social History Narrative   Not on file   Social Determinants of Health   Financial Resource Strain: Not on file  Food Insecurity: Not on file  Transportation Needs: Not on file  Physical Activity: Not on file  Stress: Not on file  Social Connections: Not on file    Allergies:  Allergies  Allergen Reactions   Other Other  (See Comments)    Allergic to Unknown Anxiety Medication which causes twitching    Metabolic Disorder Labs: No results found for: "HGBA1C", "MPG" Lab Results  Component Value Date   PROLACTIN 23.0 09/24/2021   PROLACTIN 52.1 (H) 08/14/2020   Lab Results  Component Value Date   CHOL 149 05/19/2018   TRIG 56 05/19/2018   HDL 57 05/19/2018   CHOLHDL 2.6 05/19/2018   VLDL 11 05/19/2018   [place] 81 05/19/2018   Lab Results  Component Value Date   TSH 1.430 09/24/2021   TSH 1.667 05/19/2018    Therapeutic Level Labs: No results found for: "LITHIUM" Lab Results  Component Value Date   VALPROATE 29 (L) 09/24/2021   No results found for: "CBMZ"  Current Medications: Current Outpatient Medications  Medication Sig Dispense Refill   benztropine (COGENTIN) 0.5 MG tablet TAKE 1 TABLET (0.5 MG TOTAL) BY MOUTH 2 (TWO) TIMES DAILY 60 tablet 3   Dextromethorphan-quiNIDine (NUEDEXTA) 20-10 MG capsule Take 1 capsule by mouth 2 (two) times daily. 60 capsule 2   divalproex (DEPAKOTE) 500 MG DR tablet TAKE 1 TABLET (500 MG TOTAL) BY MOUTH IN THE MORNING AND AT BEDTIME 60 tablet 3   loratadine (CLARITIN) 10 MG tablet Take 10 mg by mouth daily.     OLANZapine (ZYPREXA) 10 MG tablet Take 1 tablet (10 mg total) by mouth at bedtime. 30 tablet 3   omega-3 acid ethyl esters (LOVAZA) 1 g capsule Take 1 capsule (1 g total) by mouth 2 (two) times daily. 60 capsule 5   No current facility-administered medications for this visit.     Musculoskeletal: Strength & Muscle Tone:  Unable to assess due to telephone visit Gait & Station:  Unable to assess due to telephone visit Patient leans: N/A  Psychiatric Specialty Exam: Review of Systems  There were no vitals taken for this visit.There is no height or weight on file to calculate BMI.  General Appearance:  Unable to assess due to telephone visit  Eye Contact:   Unable to assess due to telephone visit  Speech:  Clear and Coherent and Normal Rate   Volume:  Normal  Mood:  Euthymic,   Affect:  Appropriate and Congruent  Thought Process:  Coherent, Goal Directed and Linear  Orientation:  Full (Time, Place, and Person)  Thought Content: WDL and Logical   Suicidal Thoughts:  No  Homicidal Thoughts:  No  Memory:  Immediate;   Good Recent;   Good Remote;   Good  Judgement:  Good  Insight:  Good  Psychomotor Activity:  Normal  Concentration:  Concentration: Good and Attention Span: Good  Recall:  Good  Fund of Knowledge: Good  Language: Good  Akathisia:   Unable to assess due to telephone visit  Handed:  Right  AIMS (if indicated): Not done  Assets:  Communication Skills Desire for Improvement  Financial Resources/Insurance Housing Social Support  ADL's:  Intact  Cognition: WNL  Sleep:  Good   Screenings: AIMS    Flowsheet Row Admission (Discharged) from 05/18/2018 in [facility]  AIMS Total Score 0      AUDIT    Flowsheet Row Admission (Discharged) from 05/18/2018 in [facility]  Alcohol Use Disorder Identification Test Final Score (AUDIT) 0      GAD-7    Flowsheet Row Video Visit from 10/09/2021 in [facility] Video Visit from 07/05/2021 in [facility] Video Visit from 01/30/2021 in [facility] Video Visit from 10/30/2020 in [facility] Video Visit from 07/31/2020 in [facility]  Total GAD-7 Score 9 4 16 8 1       PHQ2-9    Flowsheet Row Video Visit from 10/09/2021 in [facility] Video Visit from 07/05/2021 in [facility] Video Visit from 01/30/2021 in [facility] Video Visit from 10/30/2020 in [facility] Video Visit from 07/31/2020 in [facility]  PHQ-2 Total Score 2 0  2 0 1  PHQ-9 Total Score 11 2 9 4 4       Flowsheet Row Video Visit from 05/01/2020 in [facility] Most recent reading at 05/01/2020 11:16 AM Admission (Discharged) from 05/18/2018 in [facility] Most recent reading at 05/18/2018 11:00 PM ED from 05/18/2018 in [facility] Most recent reading at 05/18/2018 11:34 AM  C-SSRS RISK CATEGORY No Risk Moderate Risk Error: Q6 is Yes, you must answer 7        Assessment and Plan: Patient reports that she is doing well on current medication regimen.  No medication changes made today. Patient agreeable to continue medications as prescribed   1. Bipolar disorder, in partial remission, most recent episode manic (HCC)  Continue- benztropine (COGENTIN) 0.5 MG tablet; TAKE 1 TABLET (0.5 MG TOTAL) BY MOUTH 2 (TWO) TIMES DAILY  Dispense: 60 tablet; Refill: 3 Continue- divalproex (DEPAKOTE) 500 MG DR tablet; TAKE 1 TABLET (500 MG TOTAL) BY MOUTH IN THE MORNING AND AT BEDTIME  Dispense: 60 tablet; Refill: 3 Continue- OLANZapine (ZYPREXA) 10 MG tablet; Take 1 tablet (10 mg total) by mouth at bedtime.  Dispense: 30 tablet; Refill: 3  Follow up in 3 months   Salley Slaughter, NP 10/09/2021, 12:04 PM

## 2021-10-29 ENCOUNTER — Other Ambulatory Visit: Payer: Self-pay

## 2021-12-14 ENCOUNTER — Encounter (HOSPITAL_COMMUNITY): Payer: Self-pay | Admitting: Psychiatry

## 2021-12-14 ENCOUNTER — Other Ambulatory Visit: Payer: Self-pay

## 2021-12-14 ENCOUNTER — Telehealth (INDEPENDENT_AMBULATORY_CARE_PROVIDER_SITE_OTHER): Payer: No Payment, Other | Admitting: Psychiatry

## 2021-12-14 DIAGNOSIS — F3173 Bipolar disorder, in partial remission, most recent episode manic: Secondary | ICD-10-CM | POA: Diagnosis not present

## 2021-12-14 MED ORDER — DIVALPROEX SODIUM 500 MG PO DR TAB
500.0000 mg | DELAYED_RELEASE_TABLET | ORAL | 3 refills | Status: DC
  Filled 2021-12-14: qty 60, 30d supply, fill #0

## 2021-12-14 MED ORDER — OLANZAPINE 10 MG PO TABS
10.0000 mg | ORAL_TABLET | Freq: Every day | ORAL | 3 refills | Status: DC
  Filled 2021-12-14: qty 30, 30d supply, fill #0

## 2021-12-14 MED ORDER — BENZTROPINE MESYLATE 0.5 MG PO TABS
0.5000 mg | ORAL_TABLET | Freq: Two times a day (BID) | ORAL | 3 refills | Status: DC
  Filled 2021-12-14: qty 60, 30d supply, fill #0

## 2021-12-14 NOTE — Progress Notes (Signed)
BH MD/PA/NP OP Progress Note. Virtual Visit via Telephone Note  I connected with Susan Davis on [id number] at  8:00 AM EST by telephone and verified that I am speaking with the correct person using two identifiers.  Location: Patient: home Provider: Clinic   I discussed the limitations, risks, security and privacy concerns of performing an evaluation and management service by telephone and the availability of in person appointments. I also discussed with the patient that there may be a patient responsible charge related to this service. The patient expressed understanding and agreed to proceed.   I provided 30 minutes of non-face-to-face time during this encounter.        [id number] AM Susan Davis  MRN:  [id number]  Chief Complaint: "I am doing great.  I pray to cope when I need to"   HPI: 26 year old female seen today for follow up psychiatric evaluation.   She has a psychiatric history of bipolar disorder, anxiety, and depression.  She is currently being managed on Cogentin 0.5 mg daily, Zyprexa 10 mg, and Depakote 500 mg twice daily.  She notes that her medications are effective in managing her psychiatric conditions.   Today she was unable to login virtually so assessment is done over the phone.  During exam she was pleasant, cooperative, and engaged in conversation.  She informed Probation officer that she has been taking her medication as prescribed and feels mentally stable.  Patient notes that she continues to work a third shift and at times is tired part reports that her sleep is well managed.  She also reports that her anxiety and depression are well managed.  Patient request not to do a GAD-7 or PHQ-9 as she reports that she is tired this morning.  She endorses adequate sleep and appetite.  Today she denies SI/HI/VAH, mania, paranoia.    No medication changes made today. Patient agreeable to continue medications as prescribed. No other concerns noted at this time.    Visit Diagnosis:  No diagnosis found.   Past Psychiatric History: Depression, Anxiety, and Bipolar affective Past Medical History:  Past Medical History:  Diagnosis Date   Medical history non-contributory    No past surgical history on file.  Family Psychiatric History: Patient notes that several people in her family suffer from anxiety and depression however she could not give provider specifics of who they were.    Family History: No family history on file.  Social History:  Social History   Socioeconomic History   Marital status: Single    Spouse name: Not on file   Number of children: Not on file   Years of education: Not on file   Highest education level: Not on file  Occupational History   Not on file  Tobacco Use   Smoking status: Never   Smokeless tobacco: Never  Vaping Use   Vaping Use: Never used  Substance and Sexual Activity   Alcohol use: Yes    Comment: "occasionally"    Drug use: Yes    Types: Marijuana   Sexual activity: Not Currently  Other Topics Concern   Not on file  Social History Narrative   Not on file   Social Determinants of Health   Financial Resource Strain: Not on file  Food Insecurity: Not on file  Transportation Needs: Not on file  Physical Activity: Not on file  Stress: Not on file  Social Connections: Not on file    Allergies:  Allergies  Allergen Reactions   Other Other (  See Comments)    Allergic to Unknown Anxiety Medication which causes twitching    Metabolic Disorder Labs: No results found for: "HGBA1C", "MPG" Lab Results  Component Value Date   PROLACTIN 23.0 09/24/2021   PROLACTIN 52.1 (H) 08/14/2020   Lab Results  Component Value Date   CHOL 149 05/19/2018   TRIG 56 05/19/2018   HDL 57 05/19/2018   CHOLHDL 2.6 05/19/2018   VLDL 11 05/19/2018   LDLCALC 81 05/19/2018   Lab Results  Component Value Date   TSH 1.430 09/24/2021   TSH 1.667 05/19/2018    Therapeutic Level Labs: No results found for:  "LITHIUM" Lab Results  Component Value Date   VALPROATE 29 (L) 09/24/2021   No results found for: "CBMZ"  Current Medications: Current Outpatient Medications  Medication Sig Dispense Refill   benztropine (COGENTIN) 0.5 MG tablet Take 1 tablet (0.5 mg total) by mouth 2 (two) times daily. 60 tablet 3   Dextromethorphan-quiNIDine (NUEDEXTA) 20-10 MG capsule Take 1 capsule by mouth 2 (two) times daily. 60 capsule 2   divalproex (DEPAKOTE) 500 MG DR tablet Take 1 tablet (500 mg total) by mouth 2 (two) times daily in the morning and at bedtime.. 60 tablet 3   loratadine (CLARITIN) 10 MG tablet Take 10 mg by mouth daily.     OLANZapine (ZYPREXA) 10 MG tablet Take 1 tablet (10 mg total) by mouth at bedtime. 30 tablet 3   omega-3 acid ethyl esters (LOVAZA) 1 g capsule Take 1 capsule (1 g total) by mouth 2 (two) times daily. 60 capsule 5   No current facility-administered medications for this visit.     Musculoskeletal: Strength & Muscle Tone:  Unable to assess due to telephone visit Gait & Station:  Unable to assess due to telephone visit Patient leans: N/A  Psychiatric Specialty Exam: Review of Systems  There were no vitals taken for this visit.There is no height or weight on file to calculate BMI.  General Appearance:  Unable to assess due to telephone visit  Eye Contact:   Unable to assess due to telephone visit  Speech:  Clear and Coherent and Normal Rate  Volume:  Normal  Mood:  Euthymic,   Affect:  Appropriate and Congruent  Thought Process:  Coherent, Goal Directed and Linear  Orientation:  Full (Time, Place, and Person)  Thought Content: WDL and Logical   Suicidal Thoughts:  No  Homicidal Thoughts:  No  Memory:  Immediate;   Good Recent;   Good Remote;   Good  Judgement:  Good  Insight:  Good  Psychomotor Activity:  Normal  Concentration:  Concentration: Good and Attention Span: Good  Recall:  Good  Fund of Knowledge: Good  Language: Good  Akathisia:   Unable to  assess due to telephone visit  Handed:  Right  AIMS (if indicated): Not done  Assets:  Communication Skills Desire for Improvement Financial Resources/Insurance Housing Social Support  ADL's:  Intact  Cognition: WNL  Sleep:  Good   Screenings: AIMS    Flowsheet Row [facility]  AIMS Total Score 0      AUDIT    Flowsheet Row [facility]  Alcohol Use Disorder Identification Test Final Score (AUDIT) 0      GAD-7    Flowsheet Row Video Visit from 10/09/2021 in [facility] Video Visit from 07/05/2021 in [facility]  Video Visit from 01/30/2021 in [facility] Video Visit from 10/30/2020 in [facility] Video Visit from 07/31/2020 in [facility]  Total GAD-7 Score 9 4 16 8 1       PHQ2-9    Flowsheet Row Video Visit from 10/09/2021 in [facility] Video Visit from 07/05/2021 in [facility] Video Visit from 01/30/2021 in [facility] Video Visit from 10/30/2020 in [facility] Video Visit from 07/31/2020 in [facility]  PHQ-2 Total Score 2 0 2 0 1  PHQ-9 Total Score 11 2 9 4 4       Flowsheet Row Video Visit from 05/01/2020 in [facility] Most recent reading at 05/01/2020 11:16 AM Admission (Discharged) from 05/18/2018 in [facility] Most recent reading at 05/18/2018 11:00 PM ED from 05/18/2018 in [facility] Most recent reading at 05/18/2018 11:34 AM  C-SSRS RISK CATEGORY No Risk Moderate Risk Error: Q6 is Yes, you must answer 7        Assessment and Plan: Patient reports  that she is doing well on current medication regimen.  No medication changes made today. Patient agreeable to continue medications as prescribed   1. Bipolar disorder, in partial remission, most recent episode manic (HCC)  Continue- benztropine (COGENTIN) 0.5 MG tablet; TAKE 1 TABLET (0.5 MG TOTAL) BY MOUTH 2 (TWO) TIMES DAILY  Dispense: 60 tablet; Refill: 3 Continue- divalproex (DEPAKOTE) 500 MG DR tablet; TAKE 1 TABLET (500 MG TOTAL) BY MOUTH IN THE MORNING AND AT BEDTIME  Dispense: 60 tablet; Refill: 3 Continue- OLANZapine (ZYPREXA) 10 MG tablet; Take 1 tablet (10 mg total) by mouth at bedtime.  Dispense: 30 tablet; Refill: 3  Follow up in 3 months   Salley Slaughter, NP 12/14/2021, 8:06 AM

## 2021-12-18 ENCOUNTER — Other Ambulatory Visit: Payer: Self-pay

## 2022-01-03 ENCOUNTER — Other Ambulatory Visit: Payer: Self-pay

## 2022-01-14 NOTE — L&D Delivery Note (Signed)
Delivery Note Pushed well to crowning. FHR remained reassuring with small to moderate variable decelerations just prior to deliveyr. Good variability throughout.   At 10:46 PM a viable and healthy female was delivered via  (Presentation: Left Occiput Anterior).  APGAR: 8, 9; weight  .   Placenta status: Spontaneous, Intact.  Cord: 3 vessels with the following complications: Nuchal cord x 1, delivered through.   Anesthesia: Epidural Episiotomy: None Lacerations: Labial, right superficial extending just into vagina Suture Repair: vicryl rapide 4-0 Est. Blood Loss (mL): 107  Mom to postpartum.  Baby to Couplet care / Skin to Skin.  Wynelle Bourgeois 12/02/2022, 11:21 PM

## 2022-02-22 ENCOUNTER — Telehealth (HOSPITAL_COMMUNITY): Payer: Medicaid Other | Admitting: Psychiatry

## 2022-02-22 ENCOUNTER — Encounter (HOSPITAL_COMMUNITY): Payer: Self-pay

## 2022-04-01 ENCOUNTER — Other Ambulatory Visit: Payer: Self-pay

## 2022-04-01 ENCOUNTER — Emergency Department (HOSPITAL_COMMUNITY)
Admission: EM | Admit: 2022-04-01 | Discharge: 2022-04-01 | Disposition: A | Discharge status: Home / Self Care | Payer: Medicaid Other

## 2022-04-01 ENCOUNTER — Emergency Department (HOSPITAL_COMMUNITY)
Admission: EM | Admit: 2022-04-01 | Discharge: 2022-04-03 | Disposition: A | Discharge status: Home / Self Care | Payer: Medicaid Other | Attending: Emergency Medicine | Admitting: Emergency Medicine

## 2022-04-01 DIAGNOSIS — F29 Unspecified psychosis not due to a substance or known physiological condition: Secondary | ICD-10-CM | POA: Insufficient documentation

## 2022-04-01 DIAGNOSIS — F329 Major depressive disorder, single episode, unspecified: Secondary | ICD-10-CM | POA: Diagnosis present

## 2022-04-01 DIAGNOSIS — Z1152 Encounter for screening for COVID-19: Secondary | ICD-10-CM | POA: Insufficient documentation

## 2022-04-01 DIAGNOSIS — F3173 Bipolar disorder, in partial remission, most recent episode manic: Secondary | ICD-10-CM | POA: Insufficient documentation

## 2022-04-01 LAB — COMPREHENSIVE METABOLIC PANEL
ALT: 22 U/L (ref 0–44)
AST: 22 U/L (ref 15–41)
Albumin: 4.5 g/dL (ref 3.5–5.0)
Alkaline Phosphatase: 62 U/L (ref 38–126)
Anion gap: 11 (ref 5–15)
BUN: 9 mg/dL (ref 6–20)
CO2: 17 mmol/L — ABNORMAL LOW (ref 22–32)
Calcium: 9 mg/dL (ref 8.9–10.3)
Chloride: 107 mmol/L (ref 98–111)
Creatinine, Ser: 0.87 mg/dL (ref 0.44–1.00)
GFR, Estimated: >60 mL/min (ref >60)
Glucose, Bld: 76 mg/dL (ref 70–99)
Potassium: 3.6 mmol/L (ref 3.5–5.1)
Sodium: 135 mmol/L (ref 135–145)
Total Bilirubin: 0.8 mg/dL (ref 0.3–1.2)
Total Protein: 8 g/dL (ref 6.5–8.1)

## 2022-04-01 LAB — CBC WITH DIFFERENTIAL/PLATELET
Abs Immature Granulocytes: 0.02 10*3/uL (ref 0.00–0.07)
Basophils Absolute: 0.1 10*3/uL (ref 0.0–0.1)
Basophils Relative: 1 %
Eosinophils Absolute: 0.1 10*3/uL (ref 0.0–0.5)
Eosinophils Relative: 1 %
HCT: 39.5 % (ref 36.0–46.0)
Hemoglobin: 13.7 g/dL (ref 12.0–15.0)
Immature Granulocytes: 0 %
Lymphocytes Relative: 40 %
Lymphs Abs: 4.2 10*3/uL — ABNORMAL HIGH (ref 0.7–4.0)
MCH: 30.2 pg (ref 26.0–34.0)
MCHC: 34.7 g/dL (ref 30.0–36.0)
MCV: 87 fL (ref 80.0–100.0)
Monocytes Absolute: 0.9 10*3/uL (ref 0.1–1.0)
Monocytes Relative: 8 %
Neutro Abs: 5.2 10*3/uL (ref 1.7–7.7)
Neutrophils Relative %: 50 %
Platelets: 381 10*3/uL (ref 150–400)
RBC: 4.54 MIL/uL (ref 3.87–5.11)
RDW: 13.2 % (ref 11.5–15.5)
WBC: 10.4 10*3/uL (ref 4.0–10.5)
nRBC: 0 % (ref 0.0–0.2)

## 2022-04-01 LAB — ETHANOL: Alcohol, Ethyl (B): <10 mg/dL (ref <10)

## 2022-04-01 LAB — SALICYLATE LEVEL: Salicylate Lvl: <7 mg/dL — ABNORMAL LOW (ref 7.0–30.0)

## 2022-04-01 LAB — ACETAMINOPHEN LEVEL: Acetaminophen (Tylenol), Serum: <10 ug/mL — ABNORMAL LOW (ref 10–30)

## 2022-04-01 MED ORDER — STERILE WATER FOR INJECTION IJ SOLN
INTRAMUSCULAR | Status: AC
  Filled 2022-04-01: qty 10

## 2022-04-01 MED ORDER — ZIPRASIDONE MESYLATE 20 MG IM SOLR
20.0000 mg | Freq: Once | INTRAMUSCULAR | Status: AC
  Administered 2022-04-01: 20 mg via INTRAMUSCULAR
  Filled 2022-04-01: qty 20

## 2022-04-01 MED ORDER — HALOPERIDOL LACTATE 5 MG/ML IJ SOLN
5.0000 mg | Freq: Once | INTRAMUSCULAR | Status: AC
  Administered 2022-04-01: 5 mg via INTRAMUSCULAR
  Filled 2022-04-01: qty 1

## 2022-04-01 MED ORDER — LORAZEPAM 1 MG PO TABS: 1.0000 mg | ORAL_TABLET | Freq: Once | ORAL | Status: DC

## 2022-04-01 MED ORDER — LORAZEPAM 2 MG/ML IJ SOLN
2.0000 mg | Freq: Once | INTRAMUSCULAR | Status: AC
  Administered 2022-04-01: 2 mg via INTRAMUSCULAR
  Filled 2022-04-01: qty 1

## 2022-04-01 MED ORDER — DIPHENHYDRAMINE HCL 50 MG/ML IJ SOLN
25.0000 mg | Freq: Once | INTRAMUSCULAR | Status: AC
  Administered 2022-04-01: 25 mg via INTRAMUSCULAR
  Filled 2022-04-01: qty 1

## 2022-04-01 NOTE — Consult Note (Signed)
Requested to see patient for telepsych assessment via tts cart.  Per nurse Gailen Shelter, pt being moved to [facility] will set up cart once he arrives.

## 2022-04-01 NOTE — ED Notes (Signed)
Mother came to collect patient's belongings. Security gave patient's belonging's to the mother.

## 2022-04-01 NOTE — Consult Note (Signed)
Called the TTS cart to see patient for psychiatric assessment; Per April Wilson, paramedic caring for patient, she recently received prn medications (lorazepam 2mg  IM; Geodon 20mg  IM; haloperidol 5mg  IM and Benadryl 25mg  IM) for agitation and is too somnolent to participate.  Per chart review, pt has hx for schizophrenia and has been off her medications for undetermined period of time; Will allow patient to rest as this is therapeutic.   Staff will alert tts when patient is awake for assessment.

## 2022-04-01 NOTE — ED Notes (Signed)
PT belongings: red shoes, black jacket, gold necklace, black bra, black, tights,navy blue sweat pants, orange shirt, [employer] work badge, Environmental consultant.

## 2022-04-01 NOTE — ED Notes (Signed)
Patient got herself out of one of the restraints. Started trying to take off all other restraints. Pt verbally aggressive, yelling and stating that she is being hurt and needs to leave. Tried redirecting patient. MD came out of another patient's room and witnessed event, placed orders for more medications to be administered. Security at bedside due to event.
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
## 2022-04-01 NOTE — ED Notes (Signed)
Pt woke up asked for cranberry juice and a sandwich

## 2022-04-01 NOTE — ED Notes (Addendum)
Mother, Griffin Takata: [phone]

## 2022-04-01 NOTE — ED Notes (Signed)
Restraints discontinued at 1720. Moved patient to SAPU, gave April, Paramedic report on patient. Patient cooperative with move. TTS to see patient shortly.

## 2022-04-01 NOTE — ED Notes (Signed)
Attempted TTS but the pt is asleep.  Pt came back to room 37, she was released from her restraints and showed to her bed.  She walked out of her room and was shown to the restroom. She walked back and sat on the bed in RM#34.  I went and told her that she is in the room across the hall.  There were sheets and blankets in that room for her.  She walked over and got in the bed in the correct room.  Pt went to sleep.  She would not speak to me at all.

## 2022-04-01 NOTE — ED Notes (Signed)
Patient was in car, went to car with security to ask if she would like to come in and be treated. Pt refuses to get out of car. Boyfriend is requesting me get patient out of car and explained I can't remove someone from car to be seen if they are refusing
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
## 2022-04-01 NOTE — ED Notes (Signed)
Pt asleep in her room since coming back to SAPU.  TTS attempt was made but I could not wake pt.  They will attempt later.
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
## 2022-04-02 DIAGNOSIS — F3173 Bipolar disorder, in partial remission, most recent episode manic: Secondary | ICD-10-CM

## 2022-04-02 LAB — RESP PANEL BY RT-PCR (RSV, FLU A&B, COVID)  RVPGX2
Influenza A by PCR: NEGATIVE
Influenza B by PCR: NEGATIVE
Resp Syncytial Virus by PCR: NEGATIVE
SARS Coronavirus 2 by RT PCR: NEGATIVE

## 2022-04-02 MED ORDER — OLANZAPINE 10 MG PO TABS
10.0000 mg | ORAL_TABLET | Freq: Every day | ORAL | Status: DC
  Administered 2022-04-02: 10 mg via ORAL
  Filled 2022-04-02: qty 1

## 2022-04-02 MED ORDER — MELATONIN 3 MG PO TABS
3.0000 mg | ORAL_TABLET | Freq: Every day | ORAL | Status: DC
  Administered 2022-04-02: 3 mg via ORAL
  Filled 2022-04-02: qty 1

## 2022-04-02 MED ORDER — BENZTROPINE MESYLATE 0.5 MG PO TABS
0.5000 mg | ORAL_TABLET | Freq: Two times a day (BID) | ORAL | Status: DC
  Administered 2022-04-02 – 2022-04-03 (×2): 0.5 mg via ORAL
  Filled 2022-04-02 (×2): qty 1

## 2022-04-02 MED ORDER — LORAZEPAM 1 MG PO TABS
1.0000 mg | ORAL_TABLET | ORAL | Status: AC
  Administered 2022-04-02: 1 mg via ORAL
  Filled 2022-04-02: qty 1

## 2022-04-02 MED ORDER — LORAZEPAM 1 MG PO TABS
1.0000 mg | ORAL_TABLET | Freq: Once | ORAL | Status: AC
  Administered 2022-04-02: 1 mg via ORAL
  Filled 2022-04-02: qty 1

## 2022-04-02 MED ORDER — OLANZAPINE 5 MG PO TABS: 5.0000 mg | ORAL_TABLET | Freq: Three times a day (TID) | ORAL | Status: DC | PRN

## 2022-04-02 MED ORDER — LORAZEPAM 2 MG/ML IJ SOLN
2.0000 mg | Freq: Once | INTRAMUSCULAR | Status: AC
  Administered 2022-04-02: 2 mg via INTRAMUSCULAR
  Filled 2022-04-02: qty 1

## 2022-04-02 NOTE — ED Notes (Signed)
Patient tearful and restless in the room. EDP informed.
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
## 2022-04-02 NOTE — ED Notes (Signed)
Patient requesting something to help her sleep. Will notify the physician. And await orders
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
## 2022-04-02 NOTE — ED Notes (Signed)
Patient getting more agitated and restless. Ativan IM given. Will continue to monitor.

## 2022-04-02 NOTE — ED Notes (Signed)
Patient made phone call to mother asking her to check with [employer] to see if she still has a job

## 2022-04-02 NOTE — ED Notes (Signed)
Pt doing her TTS consult

## 2022-04-03 ENCOUNTER — Inpatient Hospital Stay (HOSPITAL_COMMUNITY)
Admission: AD | Admit: 2022-04-03 | Discharge: 2022-04-11 | DRG: 832 | Disposition: A | Discharge status: Home / Self Care | Payer: Medicaid Other | Source: Intra-hospital | Attending: Psychiatry | Admitting: Psychiatry

## 2022-04-03 ENCOUNTER — Encounter (HOSPITAL_COMMUNITY): Payer: Self-pay | Admitting: Psychiatry

## 2022-04-03 ENCOUNTER — Other Ambulatory Visit: Payer: Self-pay

## 2022-04-03 DIAGNOSIS — F411 Generalized anxiety disorder: Secondary | ICD-10-CM | POA: Diagnosis present

## 2022-04-03 DIAGNOSIS — F3173 Bipolar disorder, in partial remission, most recent episode manic: Secondary | ICD-10-CM | POA: Diagnosis not present

## 2022-04-03 DIAGNOSIS — O99311 Alcohol use complicating pregnancy, first trimester: Secondary | ICD-10-CM | POA: Diagnosis present

## 2022-04-03 DIAGNOSIS — Z3A00 Weeks of gestation of pregnancy not specified: Secondary | ICD-10-CM

## 2022-04-03 DIAGNOSIS — Z91410 Personal history of adult physical and sexual abuse: Secondary | ICD-10-CM | POA: Diagnosis not present

## 2022-04-03 DIAGNOSIS — F312 Bipolar disorder, current episode manic severe with psychotic features: Secondary | ICD-10-CM | POA: Diagnosis present

## 2022-04-03 DIAGNOSIS — Z8249 Family history of ischemic heart disease and other diseases of the circulatory system: Secondary | ICD-10-CM

## 2022-04-03 DIAGNOSIS — F319 Bipolar disorder, unspecified: Principal | ICD-10-CM | POA: Diagnosis present

## 2022-04-03 DIAGNOSIS — G4700 Insomnia, unspecified: Secondary | ICD-10-CM | POA: Diagnosis present

## 2022-04-03 DIAGNOSIS — Z3A01 Less than 8 weeks gestation of pregnancy: Principal | ICD-10-CM

## 2022-04-03 DIAGNOSIS — F1290 Cannabis use, unspecified, uncomplicated: Secondary | ICD-10-CM | POA: Insufficient documentation

## 2022-04-03 DIAGNOSIS — Z87891 Personal history of nicotine dependence: Secondary | ICD-10-CM

## 2022-04-03 DIAGNOSIS — O99341 Other mental disorders complicating pregnancy, first trimester: Secondary | ICD-10-CM | POA: Diagnosis present

## 2022-04-03 DIAGNOSIS — R45851 Suicidal ideations: Secondary | ICD-10-CM | POA: Diagnosis present

## 2022-04-03 DIAGNOSIS — O99321 Drug use complicating pregnancy, first trimester: Secondary | ICD-10-CM | POA: Diagnosis present

## 2022-04-03 DIAGNOSIS — Z833 Family history of diabetes mellitus: Secondary | ICD-10-CM | POA: Diagnosis not present

## 2022-04-03 DIAGNOSIS — Z91148 Patient's other noncompliance with medication regimen for other reason: Secondary | ICD-10-CM | POA: Diagnosis not present

## 2022-04-03 DIAGNOSIS — Z91411 Personal history of adult psychological abuse: Secondary | ICD-10-CM

## 2022-04-03 DIAGNOSIS — F1010 Alcohol abuse, uncomplicated: Secondary | ICD-10-CM | POA: Insufficient documentation

## 2022-04-03 DIAGNOSIS — Z888 Allergy status to other drugs, medicaments and biological substances status: Secondary | ICD-10-CM | POA: Diagnosis not present

## 2022-04-03 DIAGNOSIS — F4310 Post-traumatic stress disorder, unspecified: Secondary | ICD-10-CM | POA: Diagnosis present

## 2022-04-03 DIAGNOSIS — F1210 Cannabis abuse, uncomplicated: Secondary | ICD-10-CM | POA: Diagnosis present

## 2022-04-03 DIAGNOSIS — Z1152 Encounter for screening for COVID-19: Secondary | ICD-10-CM | POA: Diagnosis not present

## 2022-04-03 MED ORDER — ALUM & MAG HYDROXIDE-SIMETH 200-200-20 MG/5ML PO SUSP: 30.0000 mL | ORAL | Status: DC | PRN

## 2022-04-03 MED ORDER — BENZTROPINE MESYLATE 0.5 MG PO TABS
0.5000 mg | ORAL_TABLET | Freq: Two times a day (BID) | ORAL | Status: DC
  Administered 2022-04-03 – 2022-04-04 (×2): 0.5 mg via ORAL
  Filled 2022-04-03 (×7): qty 1

## 2022-04-03 MED ORDER — MAGNESIUM HYDROXIDE 400 MG/5ML PO SUSP: 30.0000 mL | Freq: Every day | ORAL | Status: DC | PRN

## 2022-04-03 MED ORDER — PNEUMOCOCCAL 20-VAL CONJ VACC 0.5 ML IM SUSY
0.5000 mL | PREFILLED_SYRINGE | INTRAMUSCULAR | Status: DC
  Filled 2022-04-03: qty 0.5

## 2022-04-03 MED ORDER — ACETAMINOPHEN 325 MG PO TABS
650.0000 mg | ORAL_TABLET | Freq: Four times a day (QID) | ORAL | Status: DC | PRN
  Administered 2022-04-05: 650 mg via ORAL
  Filled 2022-04-03: qty 2

## 2022-04-03 MED ORDER — DIPHENHYDRAMINE HCL 25 MG PO CAPS: 50.0000 mg | ORAL_CAPSULE | Freq: Three times a day (TID) | ORAL | Status: DC | PRN

## 2022-04-03 MED ORDER — LORAZEPAM 1 MG PO TABS: 2.0000 mg | ORAL_TABLET | Freq: Three times a day (TID) | ORAL | Status: DC | PRN

## 2022-04-03 MED ORDER — HALOPERIDOL 5 MG PO TABS: 5.0000 mg | ORAL_TABLET | Freq: Three times a day (TID) | ORAL | Status: DC | PRN

## 2022-04-03 MED ORDER — OLANZAPINE 10 MG PO TABS
10.0000 mg | ORAL_TABLET | Freq: Every day | ORAL | Status: DC
  Administered 2022-04-03 – 2022-04-04 (×2): 10 mg via ORAL
  Filled 2022-04-03 (×5): qty 1

## 2022-04-03 MED ORDER — INFLUENZA VAC SPLIT QUAD 0.5 ML IM SUSY
0.5000 mL | PREFILLED_SYRINGE | INTRAMUSCULAR | Status: DC
  Filled 2022-04-03: qty 0.5

## 2022-04-03 MED ORDER — LORAZEPAM 2 MG/ML IJ SOLN: 2.0000 mg | Freq: Three times a day (TID) | INTRAMUSCULAR | Status: DC | PRN

## 2022-04-03 MED ORDER — DIPHENHYDRAMINE HCL 50 MG/ML IJ SOLN: 50.0000 mg | Freq: Three times a day (TID) | INTRAMUSCULAR | Status: DC | PRN

## 2022-04-03 MED ORDER — MELATONIN 3 MG PO TABS
3.0000 mg | ORAL_TABLET | Freq: Every day | ORAL | Status: DC
  Administered 2022-04-03 – 2022-04-04 (×2): 3 mg via ORAL
  Filled 2022-04-03 (×5): qty 1

## 2022-04-03 MED ORDER — HALOPERIDOL LACTATE 5 MG/ML IJ SOLN: 5.0000 mg | Freq: Three times a day (TID) | INTRAMUSCULAR | Status: DC | PRN

## 2022-04-03 MED ORDER — TRAZODONE HCL 50 MG PO TABS
50.0000 mg | ORAL_TABLET | Freq: Every evening | ORAL | Status: DC | PRN
  Administered 2022-04-03 – 2022-04-04 (×2): 50 mg via ORAL
  Filled 2022-04-03 (×2): qty 1

## 2022-04-03 MED ORDER — DIPHENHYDRAMINE HCL 25 MG PO CAPS
25.0000 mg | ORAL_CAPSULE | Freq: Four times a day (QID) | ORAL | Status: DC | PRN
  Administered 2022-04-04 – 2022-04-06 (×3): 25 mg via ORAL
  Filled 2022-04-03 (×3): qty 1

## 2022-04-03 NOTE — ED Notes (Signed)
[facility] called for transport. 

## 2022-04-03 NOTE — ED Notes (Signed)
Regarding the Valporic acid level, pt states that she does not take Depakote any longer. Will have next shift follow up to see if we still need this level. Patient prefers not to be stuck.

## 2022-04-03 NOTE — Progress Notes (Addendum)
ADDENDUM  Pt accepted to [facility] 04/03/2022   Pt was accepted to [facility] 04/04/2022. Bed assignment: Main campus  Pt meets inpatient criteria per Michaele Offer, NP  Attending Physician will be Leta Jungling, MD  Report can be called to: [phone] (this is a pager, please leave call-back number when giving report)  Pt can arrive after 8 AM  Care Team Notified: Michaele Offer, NP, Earlyne Iba, Paramedic, and Lubertha Sayres, RN  [place], [place]  04/03/2022 12:12 PM

## 2022-04-03 NOTE — Plan of Care (Signed)
  Problem: Education: Goal: Knowledge of [facility] Education information/materials will improve Outcome: Progressing   Problem: Activity: Goal: Sleeping patterns will improve Outcome: Progressing   Problem: Coping: Goal: Ability to demonstrate self-control will improve Outcome: Progressing   
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
## 2022-04-03 NOTE — ED Notes (Addendum)
Pt is up walking up and down the hallway with her blanket around shoulders.
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
## 2022-04-03 NOTE — Progress Notes (Signed)
   04/03/22 1940  Psych Admission Type (Psych Patients Only)  Admission Status Involuntary  Psychosocial Assessment  Patient Complaints Hyperactivity  Eye Contact Fair  Facial Expression Animated  Affect Anxious  Speech Tangential;Pressured  Interaction Assertive  Motor Activity Fidgety;Restless  Appearance/Hygiene In scrubs  Behavior Characteristics Cooperative;Fidgety;Hyperactive  Mood Pleasant;Anxious  Thought Process  Coherency Tangential;Circumstantial  Content Religiosity;Paranoia  Delusions Paranoid;Religious  Perception WDL  Hallucination None reported or observed  Judgment Poor  Confusion None  Danger to Self  Current suicidal ideation? Denies  Danger to Others  Danger to Others None reported or observed

## 2022-04-03 NOTE — Progress Notes (Signed)
Pt was accepted to [facility] 04/03/2022, pending IVC paperwork faxed to [phone]. Bed assignment: [id number]  Pt meets inpatient criteria per Michaele Offer, NP  Attending Physician will be Janine Limbo, MD  Report can be called to: - Adult unit: [phone]  Pt can arrive after 1600  Care Team Notified: Michaele Offer, NP, Earlyne Iba, Paramedic, Lubertha Sayres, RN, and [facility] Lynnda Shields, RN  [place], [place]  04/03/2022 1:20 PM

## 2022-04-03 NOTE — Progress Notes (Signed)
Inpatient Behavioral Health Placement  Pt meets inpatient criteria per [facility].There are no available beds at [facility] [place].  Referral was sent to the following facilities;   Destination  Service Provider Address Phone Fax  [facility]  [address] [place] [address] [phone]  [place]  [address] [place] [id number] [phone]  [facility]  [place], [place] [address] [phone]  [facility]  [address] [place] [address] [place]  [facility]  [address] [place] [address] [phone]  [facility]  [place], [place] [address] [phone]  [place] [place] [address] [place]  [facility]  [address] [place] [address] [phone]  [facility]  [address] [place] [address] [phone]  [place] [place] [address] [phone]  [facility]  [place] [address] [phone]  [facility]  [address], [place] [address] [phone]  [facility]  [address], [place] [address] [place]  [address], [place] [address] [phone] [facility]  [address], [place] [address] [phone] [place], [place] [address] [phone]    Situation ongoing,  CSW will follow up.   Benjaman Kindler, MSW, LCSWA 04/03/2022  @  1:44 AM

## 2022-04-03 NOTE — BHH Group Notes (Signed)
Adult Psychoeducational Group Note  Date:  04/03/2022 Time:  8:23 PM  Group Topic/Focus:  Wrap-Up Group:   The focus of this group is to help patients review their daily goal of treatment and discuss progress on daily workbooks.  Participation Level:  Active  Participation Quality:  Attentive  Affect:  Appropriate  Cognitive:  Alert  Insight: Appropriate  Engagement in Group:  Engaged  Modes of Intervention:  Discussion  Additional Comments:  Patient attended and participated in the [place] group.  [name] 04/03/2022, 8:23 PM

## 2022-04-03 NOTE — ED Provider Notes (Signed)
Emergency Medicine Observation Re-evaluation Note  Susan Davis is a 27 y.o. female, seen on rounds today.  Pt initially presented to the ED for complaints of No chief complaint on file. Currently, the patient is resting.  Physical Exam  BP 132/77 (BP Location: Left Arm)   Pulse 86   Temp 97.9 F (36.6 C) (Oral)   Resp 20   Wt 77.1 kg   SpO2 100%   BMI 34.34 kg/m  Physical Exam General: nad Lungs: equal chest rise Psych: calm  ED Course / MDM  EKG:EKG Interpretation  Date/Time:  Monday April 01 2022 14:27:57 EDT Ventricular Rate:  91 PR Interval:  110 QRS Duration: 84 QT Interval:  370 QTC Calculation: 455 R Axis:   83 Text Interpretation: Sinus rhythm with short PR Otherwise normal ECG When compared with ECG of 18-May-2018 12:00, PREVIOUS ECG IS PRESENT No sig change Confirmed by Octaviano Glow [phone]) on 04/02/2022 9:57:22 AM  I have reviewed the labs performed to date as well as medications administered while in observation.  Recent changes in the last 24 hours include patient was calm overnight, she is pending placement.  Plan  Current plan is for placement.    Jeanell Sparrow, DO 04/03/22 [phone]

## 2022-04-03 NOTE — Progress Notes (Signed)
LCSW Progress Note  [id number]   [place] Kristalyn Bisping  [id number]  11:31 AM  Description:   Inpatient Psychiatric Referral  Patient was recommended inpatient per [facility], NP. There are no available beds at [facility]. Patient was referred to the following facilities:   Destination  Service Provider Address Phone Fax  [facility]  [address], [place] [address] [phone]  [place]  [address] [place] [id number] [phone]  [facility]  [place], [place] [address] [place]  [facility] [place] [address] [place]  [facility]  [address] [place] [address] [phone]  [place]  [place], [place] [address] [phone]  [facility]  [place] [place] [address] [place]  [facility]  [address] [place] [address] [phone]  [facility]  [address], [place] [address] [phone]  [place] [place] [address] [phone]  [facility]  [address] [place] [address] [phone]  [facility]  [address] [place] [address] [phone]  [facility]  [address], [place] [address] [phone]  [facility]  [place]., [place] [address] [phone] [facility]  [address], [place] [address] [phone] [place]  [place], [place] [address] [place]   [place]  [place], [place] [address] [phone] [place] [place] [address] [phone]  [facility]  [phone] [place] [address] [phone] [facility]  [address] [place], [place] [address] [phone] [facility]  [address], [place] [address] [phone]  [facility]  [address], [place] [address] [phone] [place]  [address] [place] [address] [phone]  [place] [place] [address] [phone]  [facility]  [address], [place] [address] [phone] [facility]  [address] [place] [address] [id number]    Situation ongoing, CSW to continue following and update chart as more information becomes available.      Denna Haggard, Latanya Presser  04/03/2022 11:31 AM

## 2022-04-04 DIAGNOSIS — F4310 Post-traumatic stress disorder, unspecified: Secondary | ICD-10-CM | POA: Insufficient documentation

## 2022-04-04 DIAGNOSIS — F312 Bipolar disorder, current episode manic severe with psychotic features: Secondary | ICD-10-CM | POA: Diagnosis not present

## 2022-04-04 DIAGNOSIS — F1290 Cannabis use, unspecified, uncomplicated: Secondary | ICD-10-CM | POA: Insufficient documentation

## 2022-04-04 DIAGNOSIS — F1010 Alcohol abuse, uncomplicated: Secondary | ICD-10-CM | POA: Insufficient documentation

## 2022-04-04 DIAGNOSIS — F411 Generalized anxiety disorder: Secondary | ICD-10-CM | POA: Insufficient documentation

## 2022-04-04 LAB — TSH: TSH: 1.01 u[IU]/mL (ref 0.350–4.500)

## 2022-04-04 LAB — LIPID PANEL
Cholesterol: 125 mg/dL (ref 0–200)
HDL: 58 mg/dL (ref >40)
LDL Cholesterol: 53 mg/dL (ref 0–99)
Total CHOL/HDL Ratio: 2.2 RATIO
Triglycerides: 72 mg/dL (ref <150)
VLDL: 14 mg/dL (ref 0–40)

## 2022-04-04 LAB — RAPID URINE DRUG SCREEN, HOSP PERFORMED
Amphetamines: NOT DETECTED
Barbiturates: NOT DETECTED
Benzodiazepines: NOT DETECTED
Cocaine: NOT DETECTED
Opiates: NOT DETECTED
Tetrahydrocannabinol: POSITIVE — AB

## 2022-04-04 LAB — PREGNANCY, URINE: Preg Test, Ur: POSITIVE — AB

## 2022-04-04 MED ORDER — RISPERIDONE 1 MG PO TBDP
1.0000 mg | ORAL_TABLET | Freq: Two times a day (BID) | ORAL | Status: DC
  Administered 2022-04-04: 1 mg via ORAL
  Filled 2022-04-04 (×5): qty 1

## 2022-04-04 MED ORDER — METFORMIN HCL ER 500 MG PO TB24
500.0000 mg | ORAL_TABLET | Freq: Every day | ORAL | Status: DC
  Administered 2022-04-05 – 2022-04-11 (×7): 500 mg via ORAL
  Filled 2022-04-04 (×8): qty 1

## 2022-04-04 MED ORDER — ONDANSETRON 4 MG PO TBDP: 4.0000 mg | ORAL_TABLET | Freq: Four times a day (QID) | ORAL | Status: AC | PRN

## 2022-04-04 MED ORDER — ADULT MULTIVITAMIN W/MINERALS CH
1.0000 | ORAL_TABLET | Freq: Every day | ORAL | Status: DC
  Administered 2022-04-04 – 2022-04-05 (×2): 1 via ORAL
  Filled 2022-04-04 (×3): qty 1

## 2022-04-04 MED ORDER — LORAZEPAM 1 MG PO TABS: 1.0000 mg | ORAL_TABLET | Freq: Four times a day (QID) | ORAL | Status: DC | PRN

## 2022-04-04 MED ORDER — VITAMIN B-1 100 MG PO TABS
100.0000 mg | ORAL_TABLET | Freq: Every day | ORAL | Status: DC
  Administered 2022-04-05 – 2022-04-11 (×7): 100 mg via ORAL
  Filled 2022-04-04 (×8): qty 1

## 2022-04-04 MED ORDER — LOPERAMIDE HCL 2 MG PO CAPS: 2.0000 mg | ORAL_CAPSULE | ORAL | Status: AC | PRN

## 2022-04-04 MED ORDER — WHITE PETROLATUM EX OINT
TOPICAL_OINTMENT | CUTANEOUS | Status: AC
  Administered 2022-04-04: 1
  Filled 2022-04-04: qty 5

## 2022-04-04 NOTE — Progress Notes (Signed)
Patient woke up complaining of anxiety and itching. PRN benadryl 25mg  administered. Patient remains safe at this time.

## 2022-04-04 NOTE — Group Note (Signed)
Date:  04/04/2022 Time:  11:24 PM  Group Topic/Focus:  Wrap-Up Group:   The focus of this group is to help patients review their daily goal of treatment and discuss progress on daily workbooks.    Participation Level:  Active  Participation Quality:  Appropriate  Affect:  Appropriate  Cognitive:  Appropriate  Insight: Appropriate  Engagement in Group:  Engaged  Modes of Intervention:  Education and Exploration  Additional Comments:  Patient attended and participated in group tonight. She reports that her goals were to learn how to deal with her emotions and to get more sleep. She did meet her goals today.  Salley Scarlet [facility] 04/04/2022, 11:24 PM

## 2022-04-04 NOTE — Group Note (Unsigned)
Date:  04/04/2022 Time:  10:20 PM  Group Topic/Focus:  Wrap-Up Group:   The focus of this group is to help patients review their daily goal of treatment and discuss progress on daily workbooks.     Participation Level:  {BHH PARTICIPATION HD:996081  Participation Quality:  {BHH PARTICIPATION QUALITY:22265}  Affect:  {BHH AFFECT:22266}  Cognitive:  {BHH COGNITIVE:22267}  Insight: {BHH Insight2:20797}  Engagement in Group:  {BHH ENGAGEMENT IN JY:3131603  Modes of Intervention:  {BHH MODES OF INTERVENTION:22269}  Additional Comments:  ***  Debe Coder 04/04/2022, 10:20 PM

## 2022-04-04 NOTE — Group Note (Signed)
Occupational Therapy Group Note  Group Topic: Sleep Hygiene  Group Date: 04/04/2022 Start Time: 1400 End Time: 1430 Facilitators: Brantley Stage, OT   Group Description: Group encouraged increased participation and engagement through topic focused on sleep hygiene. Patients reflected on the quality of sleep they typically receive and identified areas that need improvement. Group was given background information on sleep and sleep hygiene, including common sleep disorders. Group members also received information on how to improve one's sleep and introduced a sleep diary as a tool that can be utilized to track sleep quality over a length of time. Group session ended with patients identifying one or more strategies they could utilize or implement into their sleep routine in order to improve overall sleep quality.        Therapeutic Goal(s):  Identify one or more strategies to improve overall sleep hygiene  Identify one or more areas of sleep that are negatively impacted (sleep too much, too little, etc)     Participation Level: Engaged   Participation Quality: Independent   Behavior: Appropriate   Speech/Thought Process: Relevant   Affect/Mood: Appropriate   Insight: Fair   Judgement: Fair   Individualization: pt was active and engaged in their participation of group discussion/activity. New skills were identified  Modes of Intervention: Education  Patient Response to Interventions:  Attentive   Plan: Continue to engage patient in OT groups 2 - 3x/week.  04/04/2022  Brantley Stage, OT   Cornell Barman, OT

## 2022-04-04 NOTE — Progress Notes (Signed)
Pt A & O to self, place and situation. Denies SI, HI, AVH and pain when assessed. However, pt observed to animated with inappropriate laughter at times, increased restlessness, spontaneous running in milieu and talkative as well. Reports she slept well last night, however, per shift report she slept 3 hours. Stated to [employer], I get stuck in my head all the time. It's lots of stuff and my mind keep being worried about everything". Safety checks maintained at Q 15 minutes intervals without incident. Verbal education done on current treatment regimen, understanding verbalized. Emotional support, encouragement and reassurance offered. Required multiple redirections related to spontaneous running and intrusiveness. Safety maintained. Off unit for meals and recreational activities, returned without issues.
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
## 2022-04-04 NOTE — Group Note (Signed)
Recreation Therapy Group Note   Group Topic:Leisure Education  Group Date: 04/04/2022 Start Time: 1000 End Time: 1042 Facilitators: Kaleigh Spiegelman-McCall, LRT,CTRS Location: [address] Dayroom   Goal Area(s) Addresses:  Patient will successfully identify positive leisure and recreation activities.  Patient will acknowledge benefits of participation in healthy leisure activities post discharge.  Patient will actively work with peers toward a shared goal.   Group Description: Pictionary. In groups of 5-7, patients took turns trying to guess the picture being drawn on the board by their teammate.  If the team guessed the correct answer, they won a point.  If the team guessed wrong, the other team got a chance to steal the point. After several rounds of game play, the team with the most points were declared winners. Post-activity discussion reviewed benefits of positive recreation outlets: reducing stress, improving coping mechanisms, increasing self-esteem, and building larger support systems.    Affect/Mood: Appropriate   Participation Level: Engaged   Participation Quality: Independent   Behavior: Appropriate   Speech/Thought Process: Focused   Insight: Good   Judgement: Good   Modes of Intervention: Competitive Play   Patient Response to Interventions:  Engaged   Education Outcome:  Acknowledges education and In group clarification offered    Clinical Observations/Individualized Feedback: Pt was engaged with the activity and worked well with peers.  Pt would also cheer peers on as they prepared to draw on the board.  Pt also needed some encouragement as well for not being confident in her drawing abilities.  Pt was appropriate throughout group.  Pt was called out of group to meet with social worker but returned.    Plan: Continue to engage patient in RT group sessions 2-3x/week.   Kariss Longmire-McCall, LRT,CTRS  04/04/2022 11:57 AM

## 2022-04-04 NOTE — Plan of Care (Signed)
  Problem: Education: Goal: Emotional status will improve Outcome: Progressing Goal: Mental status will improve Outcome: Progressing Goal: Verbalization of understanding the information provided will improve Outcome: Progressing   Problem: Coping: Goal: Ability to demonstrate self-control will improve Outcome: Progressing   

## 2022-04-04 NOTE — Progress Notes (Signed)
Writer was inform by RN that patient test result show she was pregnant.  Writer advise RN to hold all psychiatric medication tonight. Pt will need to be assess by a provider in the AM.

## 2022-04-04 NOTE — Progress Notes (Signed)
   04/04/22 0512  15 Minute Checks  Location Bedroom  Visual Appearance Calm  Behavior Composed  Sleep (Behavioral Health Patients Only)  Calculate sleep? (Click Yes once per 24 hr at 0600 safety check) Yes  Documented sleep last 24 hours 3.75

## 2022-04-04 NOTE — BHH Suicide Risk Assessment (Signed)
[place] INPATIENT:  Family/Significant Other Suicide Prevention Education  Suicide Prevention Education:  Education Completed; Susan Davis (mom)- [phone],  (name of family member/significant other) has been identified by the patient as the family member/significant other with whom the patient will be residing, and identified as the person(s) who will aid the patient in the event of a mental health crisis (suicidal ideations/suicide attempt).  With written consent from the patient, the family member/significant other has been provided the following suicide prevention education, prior to the and/or following the discharge of the patient.  CSW spoke with patient mother who states that patient stopped taking medications because a voice was telling her to stop.  She reports that patient may have been connected to [facility].  She reports that she doesn't worry that she will hurt anyone and denies having guns/weapons in the home.   The suicide prevention education provided includes the following: Suicide risk factors Suicide prevention and interventions National Suicide Hotline telephone number [facility] assessment telephone number [facility] Emergency Assistance [place] and/or Residential Mobile Crisis Unit telephone number  Request made of family/significant other to: Remove weapons (e.g., guns, rifles, knives), all items previously/currently identified as safety concern.   Remove drugs/medications (over-the-counter, prescriptions, illicit drugs), all items previously/currently identified as a safety concern.  The family member/significant other verbalizes understanding of the suicide prevention education information provided.  The family member/significant other agrees to remove the items of safety concern listed above.  Susan Davis 04/04/2022, 1:19 PM

## 2022-04-04 NOTE — BHH Suicide Risk Assessment (Signed)
[facility] Admission Suicide Risk Assessment   Nursing information obtained from:  Patient Demographic factors:  Adolescent or young adult Current Mental Status:  NA Loss Factors:  Loss of significant relationship ("I'm out of work now because I'm in here") Historical Factors:  Impulsivity Risk Reduction Factors:  Religious beliefs about death, Employed, Sense of responsibility to family  Total Time spent with patient: 30 minutes Principal Problem: Severe manic bipolar 1 disorder with psychotic behavior ([place]) Diagnosis:  Principal Problem:   Severe manic bipolar 1 disorder with psychotic behavior ([place]) Active Problems:   GAD (generalized anxiety disorder)   PTSD (post-traumatic stress disorder)   Cannabis use disorder   Alcohol abuse  Subjective Data: See H&P    Continued Clinical Symptoms:  Alcohol Use Disorder Identification Test Final Score (AUDIT): 6 The "Alcohol Use Disorders Identification Test", Guidelines for Use in Primary Care, Second Edition.  World Pharmacologist [facility]). Score between 0-7:  no or low risk or alcohol related problems. Score between 8-15:  moderate risk of alcohol related problems. Score between 16-19:  high risk of alcohol related problems. Score 20 or above:  warrants further diagnostic evaluation for alcohol dependence and treatment.   CLINICAL FACTORS:   Severe Anxiety and/or Agitation Bipolar Disorder:   manic  More than one psychiatric diagnosis Currently Psychotic Unstable or Poor Therapeutic Relationship Previous Psychiatric Diagnoses and Treatments     Psychiatric Specialty Exam:  Presentation  General Appearance:  Appropriate for Environment  Eye Contact: Fair  Speech: Normal Rate  Speech Volume: Normal  Handedness: Right   Mood and Affect  Mood: -- ("Gone, no control of myself")  Affect: -- (Anxious)   Thought Process  Thought Processes: Linear  Descriptions of Associations:Intact  Orientation:Full (Time,  Place and Person)  Thought Content:Logical  History of Schizophrenia/Schizoaffective disorder:No  Duration of Psychotic Symptoms:N/A  Hallucinations:Hallucinations: None Was having command AH prior to admission  Ideas of Reference:None  Suicidal Thoughts:Suicidal Thoughts: Yes, Passive SI Passive Intent and/or Plan: Without Intent; Without Plan  Homicidal Thoughts:Homicidal Thoughts: No   Sensorium  Memory: Immediate Poor; Recent Poor; Remote Poor  Judgment: Impaired  Insight: Poor   Executive Functions  Concentration: Fair  Attention Span: Fair  Recall: [place] of Knowledge: Fair  Language: Fair   Psychomotor Activity  Psychomotor Activity: Psychomotor Activity: Normal   Assets  Assets: Communication Skills; Desire for Improvement; Housing; Social Support   Sleep  Sleep: Sleep: Poor    Physical Exam: Physical Exam Vitals reviewed.  Constitutional:      General: She is not in acute distress.    Appearance: She is normal weight. She is not toxic-appearing.  Pulmonary:     Effort: Pulmonary effort is normal.  Neurological:     Mental Status: She is alert.     Motor: No weakness.     Gait: Gait normal.    Review of Systems  Constitutional:  Negative for chills and fever.  Cardiovascular:  Negative for chest pain and palpitations.  Neurological:  Negative for dizziness, tingling, tremors and headaches.  Psychiatric/Behavioral:  Positive for depression, hallucinations, substance abuse and suicidal ideas. The patient is nervous/anxious and has insomnia.    Blood pressure 131/84, pulse (!) 101, temperature 98.6 F (37 C), temperature source Oral, resp. rate 20, height 5' (1.524 m), weight 58.8 kg, SpO2 100 %. Body mass index is 25.31 kg/m.   COGNITIVE FEATURES THAT CONTRIBUTE TO RISK:  None    SUICIDE RISK:   Moderate:  Frequent suicidal ideation with  limited intensity, and duration, some specificity in terms of plans, no  associated intent, good self-control, limited dysphoria/symptomatology, some risk factors present, and identifiable protective factors, including available and accessible social support.  PLAN OF CARE: See H&P  I certify that inpatient services furnished can reasonably be expected to improve the patient's condition.   Christoper Allegra, MD 04/04/2022, 3:25 PM

## 2022-04-04 NOTE — Progress Notes (Addendum)
   04/04/22 1955  Psych Admission Type (Psych Patients Only)  Admission Status Involuntary  Psychosocial Assessment  Patient Complaints Hyperactivity;Restlessness  Eye Contact Fair  Facial Expression Animated  Affect Anxious;Appropriate to circumstance  Speech Tangential;Pressured  Interaction Assertive  Motor Activity Fidgety;Restless;Hyperactive  Appearance/Hygiene Improved  Behavior Characteristics Cooperative;Fidgety  Mood Anxious;Pleasant  Thought Process  Coherency Tangential  Content Religiosity;Paranoia  Delusions Paranoid;Religious  Perception WDL  Hallucination None reported or observed  Judgment Poor  Confusion None  Danger to Self  Current suicidal ideation? Denies  Danger to Others  Danger to Others None reported or observed   Positive pregnancy test noted after evening medication administration. Providers and AC alerted. Patient remains safe at this time.
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
## 2022-04-04 NOTE — Progress Notes (Signed)
Recreation Therapy Notes  INPATIENT RECREATION THERAPY ASSESSMENT  Patient Details Name: Susan Davis MRN: [id number] DOB: [birth year] Today's Date: 04/04/2022       Information Obtained From: Patient  Able to Participate in Assessment/Interview: Yes  Patient Presentation: Alert  Reason for Admission (Per Patient): Med Non-Compliance, Other (Comments)  Patient Stressors: Other (Comment) ("people in my life", "my mom keeping things from me")  Coping Skills:   Isolation, Self-Injury, Write, Sports, TV, Arguments, Aggression, Music, Exercise, Deep Breathing, Avoidance, Substance Abuse, Impulsivity, Talk, Prayer, Intrusive Behavior, Read, Hot Bath/Shower, Dance  Leisure Interests (2+):  Nature - Hiking, Therapist, music - Other (Comment), Individual - Other (Comment) (Smoke weed, see the animals in nature)  Frequency of Recreation/Participation: Other (Comment) (Daily)  Awareness of Community Resources:  Yes  Community Resources:  [place], [employer], Tax inspector, Patent examiner, Engineer, drilling  Current Use: Yes  If no, Barriers?:    Expressed Interest in [employer] Information: No  [employer] of Residence:  Investment banker, corporate  Patient Main Form of Transportation: Musician  Patient Strengths:  "care about others and how they genuinely feel"  Patient Identified Areas of Improvement:  "addiction, overthinking, anger, past hurt"  Patient Goal for Hospitalization:  "get all of that under control, get back on medications"  Current SI (including self-harm):  No  Current HI:  No  Current AVH: No  Staff Intervention Plan: Group Attendance, Collaborate with Interdisciplinary Treatment Team  Consent to Intern Participation: N/A   Cornelis Kluver-McCall, LRT,CTRS Adren Dollins A Lavita Pontius-McCall 04/04/2022, 12:40 PM

## 2022-04-05 ENCOUNTER — Encounter (HOSPITAL_COMMUNITY): Payer: Self-pay

## 2022-04-05 DIAGNOSIS — F312 Bipolar disorder, current episode manic severe with psychotic features: Secondary | ICD-10-CM | POA: Diagnosis not present

## 2022-04-05 LAB — HEMOGLOBIN A1C
Hgb A1c MFr Bld: 5.5 % (ref 4.8–5.6)
Mean Plasma Glucose: 111 mg/dL

## 2022-04-05 LAB — VALPROIC ACID LEVEL: Valproic Acid Lvl: <10 ug/mL — ABNORMAL LOW (ref 50.0–100.0)

## 2022-04-05 MED ORDER — OLANZAPINE 5 MG PO TBDP
5.0000 mg | ORAL_TABLET | Freq: Every day | ORAL | Status: DC
  Administered 2022-04-05 – 2022-04-07 (×3): 5 mg via ORAL
  Filled 2022-04-05 (×5): qty 1

## 2022-04-05 MED ORDER — PRENATAL MULTIVITAMIN CH
1.0000 | ORAL_TABLET | Freq: Every day | ORAL | Status: DC
  Administered 2022-04-05 – 2022-04-10 (×6): 1 via ORAL
  Filled 2022-04-05 (×9): qty 1

## 2022-04-05 MED ORDER — MELATONIN 5 MG PO TABS
5.0000 mg | ORAL_TABLET | Freq: Every day | ORAL | Status: DC
  Administered 2022-04-05 – 2022-04-10 (×6): 5 mg via ORAL
  Filled 2022-04-05 (×8): qty 1

## 2022-04-05 MED ORDER — DOXYLAMINE SUCCINATE (SLEEP) 25 MG PO TABS
25.0000 mg | ORAL_TABLET | Freq: Every evening | ORAL | Status: DC | PRN
  Administered 2022-04-07 – 2022-04-10 (×5): 25 mg via ORAL
  Filled 2022-04-05 (×5): qty 1

## 2022-04-05 MED ORDER — OLANZAPINE 15 MG PO TBDP
15.0000 mg | ORAL_TABLET | Freq: Every day | ORAL | Status: DC
  Administered 2022-04-05 – 2022-04-06 (×2): 15 mg via ORAL
  Filled 2022-04-05 (×3): qty 1

## 2022-04-05 MED ORDER — WHITE PETROLATUM EX OINT
TOPICAL_OINTMENT | CUTANEOUS | Status: AC
  Filled 2022-04-05: qty 10

## 2022-04-05 NOTE — Group Note (Signed)
Date:  04/05/2022 Time:  9:27 PM  Group Topic/Focus:  Wrap-Up Group:   The focus of this group is to help patients review their daily goal of treatment and discuss progress on daily workbooks.    Participation Level:  Did Not Attend  Participation Quality:Filomeno Cromley Dacosta 04/05/2022, 9:27 PM

## 2022-04-05 NOTE — BH IP Treatment Plan (Signed)
Interdisciplinary Treatment and Diagnostic Plan Update  04/05/2022 Time of Session: [id number] Susan Davis MRN: [id number]  Principal Diagnosis: Severe manic bipolar 1 disorder with psychotic behavior ([place])  Secondary Diagnoses: Principal Problem:   Severe manic bipolar 1 disorder with psychotic behavior ([place]) Active Problems:   GAD (generalized anxiety disorder)   PTSD (post-traumatic stress disorder)   Cannabis use disorder   Alcohol abuse   Current Medications:  Current Facility-Administered Medications  Medication Dose Route Frequency Provider Last Rate Last Admin   acetaminophen (TYLENOL) tablet 650 mg  650 mg Oral Q6H PRN Motley-Mangrum, Jadeka A, PMHNP   650 mg at 04/05/22 0300   alum & mag hydroxide-simeth (MAALOX/MYLANTA) 200-200-20 MG/5ML suspension 30 mL  30 mL Oral Q4H PRN Motley-Mangrum, Jadeka A, PMHNP       diphenhydrAMINE (BENADRYL) capsule 25 mg  25 mg Oral Q6H PRN Motley-Mangrum, Jadeka A, PMHNP   25 mg at 04/04/22 2055   diphenhydrAMINE (BENADRYL) capsule 50 mg  50 mg Oral TID PRN Motley-Mangrum, Jadeka A, PMHNP       Or   diphenhydrAMINE (BENADRYL) injection 50 mg  50 mg Intramuscular TID PRN Motley-Mangrum, Jadeka A, PMHNP       doxylamine (Sleep) (UNISOM) tablet 25 mg  25 mg Oral QHS PRN Massengill, Nathan, MD       haloperidol (HALDOL) tablet 5 mg  5 mg Oral TID PRN Motley-Mangrum, Jadeka A, PMHNP       Or   haloperidol lactate (HALDOL) injection 5 mg  5 mg Intramuscular TID PRN Motley-Mangrum, Jadeka A, PMHNP       influenza vac split quadrivalent PF (FLUARIX) injection 0.5 mL  0.5 mL Intramuscular Tomorrow-1000 Massengill, Nathan, MD       loperamide (IMODIUM) capsule 2-4 mg  2-4 mg Oral PRN Massengill, Ovid Curd, MD       magnesium hydroxide (MILK OF MAGNESIA) suspension 30 mL  30 mL Oral Daily PRN Motley-Mangrum, Jadeka A, PMHNP       melatonin tablet 5 mg  5 mg Oral QHS Massengill, Nathan, MD       metFORMIN (GLUCOPHAGE-XR) 24 hr tablet 500 mg  500 mg Oral  Q breakfast Massengill, Nathan, MD   500 mg at 04/05/22 0752   OLANZapine zydis (ZYPREXA) disintegrating tablet 15 mg  15 mg Oral QHS Massengill, Nathan, MD       OLANZapine zydis (ZYPREXA) disintegrating tablet 5 mg  5 mg Oral Daily Massengill, Ovid Curd, MD       ondansetron (ZOFRAN-ODT) disintegrating tablet 4 mg  4 mg Oral Q6H PRN Massengill, Nathan, MD       pneumococcal 20-valent conjugate vaccine (PREVNAR 20) injection 0.5 mL  0.5 mL Intramuscular Tomorrow-1000 Massengill, Nathan, MD       prenatal multivitamin tablet 1 tablet  1 tablet Oral Q1200 Massengill, Nathan, MD   1 tablet at 04/05/22 1309   thiamine (Vitamin B-1) tablet 100 mg  100 mg Oral Daily Massengill, Ovid Curd, MD   100 mg at 04/05/22 [id number]   PTA Medications: Medications Prior to Admission  Medication Sig Dispense Refill Last Dose   benztropine (COGENTIN) 0.5 MG tablet Take 1 tablet (0.5 mg total) by mouth 2 (two) times daily. 60 tablet 3    Dextromethorphan-quiNIDine (NUEDEXTA) 20-10 MG capsule Take 1 capsule by mouth 2 (two) times daily. (Patient not taking: Reported on 04/01/2022) 60 capsule 2    divalproex (DEPAKOTE) 500 MG DR tablet Take 1 tablet (500 mg total) by mouth 2 (two) times daily in the morning  and at bedtime.. 60 tablet 3    OLANZapine (ZYPREXA) 10 MG tablet Take 1 tablet (10 mg total) by mouth at bedtime. 30 tablet 3    omega-3 acid ethyl esters (LOVAZA) 1 g capsule Take 1 capsule (1 g total) by mouth 2 (two) times daily. (Patient not taking: Reported on 04/01/2022) 60 capsule 5     Patient Stressors: Educational concerns   Medication change or noncompliance   Substance abuse   Traumatic event    Patient Strengths: Capable of independent living  Camera operator means  Religious Affiliation  Supportive family/friends  Work skills   Treatment Modalities: Medication Management, Group therapy, Case management,  1 to 1 session with clinician, Psychoeducation, Recreational  therapy.   Physician Treatment Plan for Primary Diagnosis: Severe manic bipolar 1 disorder with psychotic behavior ([place]) Long Term Goal(s): Improvement in symptoms so as ready for discharge   Short Term Goals: Ability to identify changes in lifestyle to reduce recurrence of condition will improve Ability to verbalize feelings will improve Ability to disclose and discuss suicidal ideas Ability to demonstrate self-control will improve Ability to identify and develop effective coping behaviors will improve Compliance with prescribed medications will improve Ability to identify triggers associated with substance abuse/mental health issues will improve  Medication Management: Evaluate patient's response, side effects, and tolerance of medication regimen.  Therapeutic Interventions: 1 to 1 sessions, Unit Group sessions and Medication administration.  Evaluation of Outcomes: Progressing  Physician Treatment Plan for Secondary Diagnosis: Principal Problem:   Severe manic bipolar 1 disorder with psychotic behavior ([place]) Active Problems:   GAD (generalized anxiety disorder)   PTSD (post-traumatic stress disorder)   Cannabis use disorder   Alcohol abuse  Long Term Goal(s): Improvement in symptoms so as ready for discharge   Short Term Goals: Ability to identify changes in lifestyle to reduce recurrence of condition will improve Ability to verbalize feelings will improve Ability to disclose and discuss suicidal ideas Ability to demonstrate self-control will improve Ability to identify and develop effective coping behaviors will improve Compliance with prescribed medications will improve Ability to identify triggers associated with substance abuse/mental health issues will improve     Medication Management: Evaluate patient's response, side effects, and tolerance of medication regimen.  Therapeutic Interventions: 1 to 1 sessions, Unit Group sessions and Medication  administration.  Evaluation of Outcomes: Progressing   RN Treatment Plan for Primary Diagnosis: Severe manic bipolar 1 disorder with psychotic behavior ([place]) Long Term Goal(s): Knowledge of disease and therapeutic regimen to maintain health will improve  Short Term Goals: Ability to remain free from injury will improve, Ability to verbalize frustration and anger appropriately will improve, Ability to demonstrate self-control, Ability to participate in decision making will improve, Ability to verbalize feelings will improve, Ability to disclose and discuss suicidal ideas, Ability to identify and develop effective coping behaviors will improve, and Compliance with prescribed medications will improve  Medication Management: RN will administer medications as ordered by provider, will assess and evaluate patient's response and provide education to patient for prescribed medication. RN will report any adverse and/or side effects to prescribing provider.  Therapeutic Interventions: 1 on 1 counseling sessions, Psychoeducation, Medication administration, Evaluate responses to treatment, Monitor vital signs and CBGs as ordered, Perform/monitor CIWA, COWS, AIMS and Fall Risk screenings as ordered, Perform wound care treatments as ordered.  Evaluation of Outcomes: Progressing   LCSW Treatment Plan for Primary Diagnosis: Severe manic bipolar 1 disorder with psychotic behavior ([place]) Long Term Goal(s): Safe transition  to appropriate next level of care at discharge, Engage patient in therapeutic group addressing interpersonal concerns.  Short Term Goals: Engage patient in aftercare planning with referrals and resources, Increase social support, Increase ability to appropriately verbalize feelings, Increase emotional regulation, Facilitate acceptance of mental health diagnosis and concerns, Facilitate patient progression through stages of change regarding substance use diagnoses and concerns, Identify triggers  associated with mental health/substance abuse issues, and Increase skills for wellness and recovery  Therapeutic Interventions: Assess for all discharge needs, 1 to 1 time with Social worker, Explore available resources and support systems, Assess for adequacy in community support network, Educate family and significant other(s) on suicide prevention, Complete Psychosocial Assessment, Interpersonal group therapy.  Evaluation of Outcomes: Progressing   Progress in Treatment: Attending groups: Yes. Participating in groups: Yes. Taking medication as prescribed: Yes. Toleration medication: Yes. Family/Significant other contact made: Yes, individual(s) contacted:  [name] (mom)- [phone] Patient understands diagnosis: Yes. Discussing patient identified problems/goals with staff: Yes. Medical problems stabilized or resolved: Yes. Denies suicidal/homicidal ideation: Yes. Issues/concerns per patient self-inventory: Yes. Other:   New problem(s) identified: No, Describe:  None Reported  New Short Term/Long Term Goal(s): medication stabilization, elimination of SI thoughts, development of comprehensive mental wellness plan.    Patient Goals:  Coping Skills/ Med Management  Discharge Plan or Barriers: Patient recently admitted. CSW will continue to follow and assess for appropriate referrals and possible discharge planning.    Reason for Continuation of Hospitalization: Anxiety Mania Medication stabilization Withdrawal symptoms  Estimated Length of Stay: 3-7 Days  Last [place] Suicide Severity Risk Score: [place] Admission (Current) from 04/03/2022 in [facility] ED from 04/01/2022 in [facility] Department [facility] Video Visit from 05/01/2020 in [place] No Risk No Risk No Risk       Last [facility] 2/9 Scores:    10/09/2021   12:01 PM 07/05/2021    2:29 PM 01/30/2021     9:41 AM  Depression screen PHQ 2/9  Decreased Interest 2 0 0  Down, Depressed, Hopeless 0 0 2  PHQ - 2 Score 2 0 2  Altered sleeping 2 0 0  Tired, decreased energy 0 0 2  Change in appetite 2 1 2   Feeling bad or failure about yourself  3 1 1   Trouble concentrating 2 0 1  Moving slowly or fidgety/restless 0 0 1  Suicidal thoughts 0 0 0  PHQ-9 Score 11 2 9   Difficult doing work/chores Not difficult at all Not difficult at all Not difficult at all   detox, medication management for mood stabilization; elimination of SI thoughts; development of comprehensive mental wellness/sobriety plan  .   Scribe for Treatment Team: Windle Guard, LCSW 04/05/2022 2:27 PM

## 2022-04-05 NOTE — Progress Notes (Signed)
Adult Psychoeducational Group Note  Date:  04/05/2022 Time:  6:09 PM  Group Topic/Focus:  Personal Choices and Values:   The focus of this group is to help patients assess and explore the importance of values in their lives, how their values affect their decisions, how they express their values and what opposes their expression.  Participation Level:  Active  Participation Quality:  Appropriate  Affect:  Appropriate  Cognitive:  Appropriate  Insight: Appropriate  Engagement in Group:  Engaged  Modes of Intervention:  Discussion  Additional Comments:The patient engage in group.  Nash Shearer 04/05/2022, 6:09 PM

## 2022-04-05 NOTE — Progress Notes (Signed)
Angelo found out today that she is pregnant and is very excited by this news. She had her mother let FOB know about pregnancy. She wants him to know but doesn't necessarily want him to be in her life. She wants to eat well and not put any marijuana or other unprescribed substances into her body.  She is hopeful that she will be a good mother and asked chaplain to pray for her.  [address], [place] Pager, [phone]

## 2022-04-05 NOTE — Progress Notes (Signed)
   04/05/22 0602  15 Minute Checks  Location Dayroom  Visual Appearance Calm  Behavior Composed  Sleep (Behavioral Health Patients Only)  Calculate sleep? (Click Yes once per 24 hr at 0600 safety check) Yes  Documented sleep last 24 hours 2.5

## 2022-04-05 NOTE — Group Note (Signed)
Recreation Therapy Group Note   Group Topic:Self-Esteem  Group Date: 04/05/2022 Start Time: 1030 End Time: 1100 Facilitators: Patrena Santalucia-McCall, LRT,CTRS Location: [address] Dayroom   Goal Area(s) Addresses:  Patient will appropriately identify what self esteem is.  Patient will create a shield of armor describing themselves.  Patient will successfully identify positive attributes about themselves.  Patient will acknowledge benefit of improved self-esteem.    Group Description: Self-Esteem Shield. Patient attended a recreation therapy group session focused on self esteem. Patient identified what self esteem is, and why it is important to have high self esteem during group discussion.  Patient was asked to create their own shield to show off their unique attributes, four quadrants reflected the following:   The Upper Left quadrant- 2 things or people you value The Upper Right quadrant- 2 lessons you have learned thus far in life The Lower Left quadrant- 3 qualities that make you unique The Lower Right quadrant- 1 goal you want to work towards   Patients were provided sheets with the shield printed on them and colored pencils, markers and crayons to complete the activity.  Patients and Probation officer listened to music as they worked on their shields. Patients were debriefed on the importance of healthy self esteem and offered a handout for ways to increase self esteem.    Affect/Mood: Appropriate   Participation Level: Engaged   Participation Quality: Independent   Behavior: Appropriate   Speech/Thought Process: Focused   Insight: Good   Judgement: Good   Modes of Intervention: Art   Patient Response to Interventions:  Engaged   Education Outcome:  Acknowledges education and In group clarification offered    Clinical Observations/Individualized Feedback: Pt was engaged and bright throughout group.  Pt sang along to some of the songs that played.  Pt identified the people  she value as God, Rosemarie and [place].  Pt expressed the 2 lessons she has learned as being still and waiting on God/ just pray and have mustard seed faith.  Pt stated her love of animals, earth and God makes her unique and the goal she wants to accomplish is to stop over thinking and being scared because "something bad will happen".    Plan: Continue to engage patient in RT group sessions 2-3x/week.   Aerionna Moravek-McCall, LRT,CTRS 04/05/2022 12:00 PM
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
## 2022-04-05 NOTE — Progress Notes (Addendum)
Pt visible in milieu for longer intervals this shift. Observed with labile mood as evidenced by intermittent tearfulness and animation at intervals especially in reference to the news about her pregnancy "I'm going to keep it. I don't care about him. I'm going to take care of me and my baby" but she has been verbally redirectable. Speech is tangential with hyper-religious theme but has been cooperative with care and unit routines. Met with chaplain this shift per her request. Attended scheduled groups and off unit activities; returned without issue. Support, reassurance and encouragement offered. Verbal education done on current treatment regimen and understanding verbalized. Safety checks maintained at Q 15 minutes intervals without incident. Off unit to gym and for meals this shift without incident. Tolerated meals, fluids and medications well. Labs done this eveing. Denies concerns at this time.

## 2022-04-05 NOTE — [facility] Group Notes (Signed)
Spirituality group facilitated by Kathrynn Humble, [place].  Group Description: Group focused on topic of hope. Patients participated in facilitated discussion around topic, connecting with one another around experiences and definitions for hope. Group members engaged with visual explorer photos, reflecting on what hope looks like for them today. Group engaged in discussion around how their definitions of hope are present today in hospital.  Modalities: Psycho-social ed, Adlerian, Narrative, MI  Patient Progress: Susan Davis attended group and actively engaged in group conversation and activities.

## 2022-04-05 NOTE — Group Note (Signed)
Date:  04/05/2022 Time:  1:53 PM  Group Topic/Focus:  Goals Group:   The focus of this group is to help patients establish daily goals to achieve during treatment and discuss how the patient can incorporate goal setting into their daily lives to aide in recovery. Orientation:   The focus of this group is to educate the patient on the purpose and policies of crisis stabilization and provide a format to answer questions about their admission.  The group details unit policies and expectations of patients while admitted.    Participation Level:  Active  Participation Quality:  Appropriate  Affect:  Appropriate  Cognitive:  Appropriate  Insight: Appropriate  Engagement in Group:  Engaged  Modes of Intervention:  Discussion  Additional Comments:  Patient attended morning orientation/goal setting group and said that her goal for today is stay clam.   Nyelah Emmerich W Jahree Dermody 0000000, 1:53 PM

## 2022-04-05 NOTE — Group Note (Signed)
Date:  04/05/2022 Time:  2:34 PM  Group Topic/Focus:  Spirituality:   The focus of this group is to discuss how one's spirituality can aide in recovery.    Participation Level:  Active  Participation Quality:  Appropriate  Affect:  Appropriate  Cognitive:  Appropriate  Insight: Appropriate  Engagement in Group:  Engaged  Modes of Intervention:  Discussion  Additional Comments:  Pt attended morning music therapy.   Susan Davis W [name] 0000000, 2:34 PM

## 2022-04-06 NOTE — Progress Notes (Signed)
No s/s of withdrawals at this time. Pt is engaging in conversation with peers and pacing hallway occasionally.

## 2022-04-06 NOTE — Progress Notes (Signed)
   04/05/22 2000  Psych Admission Type (Psych Patients Only)  Admission Status Involuntary  Psychosocial Assessment  Patient Complaints Anxiety;Hyperactivity  Eye Contact Fair  Facial Expression Animated;Anxious  Affect Appropriate to circumstance  Training and development officer Assertive  Motor Activity Fidgety;Restless  Appearance/Hygiene Improved  Behavior Characteristics Cooperative  Mood Anxious;Pleasant  Thought Process  Coherency Tangential  Content Religiosity  Delusions Paranoid  Perception WDL  Hallucination None reported or observed  Judgment Poor  Confusion None  Danger to Self  Current suicidal ideation? Denies  Danger to Others  Danger to Others None reported or observed   Patient has been isolative to her room this shift compliant with medications no adverse effects noted. Support and encuragement provided.

## 2022-04-06 NOTE — Progress Notes (Signed)
Adult Psychoeducational Group Note  Date:  04/06/2022 Time:  1:40 PM  Group Topic/Focus:  Goals Group:   The focus of this group is to help patients establish daily goals to achieve during treatment and discuss how the patient can incorporate goal setting into their daily lives to aide in recovery.  Participation Level:  Active  Participation Quality:  Appropriate  Affect:  Appropriate  Cognitive:  Appropriate  Insight: Appropriate  Engagement in Group:  Engaged  Modes of Intervention:  Discussion  Additional Comments:  The patient attended group  Susan Davis 04/06/2022, 1:40 PM

## 2022-04-06 NOTE — Progress Notes (Signed)
Patient has been pacing this morning appears to be responding in her room when MHT asked her she said she was talking to God. She continues to be Tangential. Support and encouragement provided.

## 2022-04-06 NOTE — Group Note (Signed)
Date:  04/06/2022 Time:  10:18 PM  Group Topic/Focus:  Wrap-Up Group:   The focus of this group is to help patients review their daily goal of treatment and discuss progress on daily workbooks.    Participation Level:  Active  Participation Quality:  Appropriate  Affect:  Appropriate  Cognitive:  Appropriate  Insight: Appropriate  Engagement in Group:  Engaged  Modes of Intervention:  Education and Exploration  Additional Comments:  Patient attended and participated in group tonight.  She reports that the thing she likes about herself is she love animals as much as she loves people.  Salley Scarlet [facility] 04/06/2022, 10:18 PM

## 2022-04-06 NOTE — Progress Notes (Signed)
   04/06/22 1100  Psych Admission Type (Psych Patients Only)  Admission Status Involuntary  Psychosocial Assessment  Patient Complaints Hyperactivity  Eye Contact Fair  Facial Expression Animated  Affect Appropriate to circumstance  Speech Tangential  Interaction Assertive  Motor Activity Fidgety  Appearance/Hygiene In scrubs  Behavior Characteristics Cooperative  Mood Anxious  Thought Process  Coherency Tangential  Content Religiosity  Delusions Paranoid  Perception WDL  Hallucination None reported or observed  Judgment Poor  Confusion None  Danger to Self  Current suicidal ideation? Denies  Danger to Others  Danger to Others None reported or observed
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
## 2022-04-06 NOTE — Progress Notes (Signed)
   04/06/22 2200  Psych Admission Type (Psych Patients Only)  Admission Status Involuntary  Psychosocial Assessment  Patient Complaints Anxiety  Eye Contact Fair  Facial Expression Animated  Affect Appropriate to circumstance  Speech Logical/coherent  Interaction Assertive  Motor Activity Slow  Appearance/Hygiene In scrubs  Behavior Characteristics Cooperative;Appropriate to situation  Mood Pleasant  Thought Process  Coherency WDL  Content WDL  Delusions None reported or observed  Perception WDL  Hallucination None reported or observed  Judgment Poor  Confusion None

## 2022-04-07 DIAGNOSIS — F312 Bipolar disorder, current episode manic severe with psychotic features: Secondary | ICD-10-CM | POA: Diagnosis not present

## 2022-04-07 LAB — BETA HCG QUANT (REF LAB): hCG Quant: 4394 m[IU]/mL

## 2022-04-07 MED ORDER — OLANZAPINE 15 MG PO TBDP
15.0000 mg | ORAL_TABLET | Freq: Every day | ORAL | Status: AC
  Administered 2022-04-07: 15 mg via ORAL
  Filled 2022-04-07: qty 1

## 2022-04-07 MED ORDER — OLANZAPINE 10 MG PO TBDP
20.0000 mg | ORAL_TABLET | Freq: Every day | ORAL | Status: DC
  Administered 2022-04-08 – 2022-04-10 (×3): 20 mg via ORAL
  Filled 2022-04-07 (×4): qty 2

## 2022-04-07 MED ORDER — OLANZAPINE 10 MG PO TBDP: 20.0000 mg | ORAL_TABLET | Freq: Every day | ORAL | Status: DC

## 2022-04-07 NOTE — Progress Notes (Signed)
Writer spoke with patient 1:1 and she reports having had a good day and hopes to sleep better tonight than last night. Writer suggested that she take her melatonin first and if still having trouble sleeping to take her unisom to help her fall back asleep. She reports that she will try not to come and talk to staff instead of trying to fall back asleep. Support given and safety maintained with 15 min checks.  04/07/22 2012  Psych Admission Type (Psych Patients Only)  Admission Status Involuntary  Psychosocial Assessment  Patient Complaints Worrying  Eye Contact Fair  Facial Expression Flat  Affect Appropriate to circumstance  Speech Logical/coherent  Interaction Assertive  Motor Activity Slow  Appearance/Hygiene In scrubs  Behavior Characteristics Cooperative;Appropriate to situation  Mood Pleasant  Thought Process  Coherency WDL  Content WDL  Delusions None reported or observed  Perception WDL  Hallucination None reported or observed  Judgment Poor  Confusion None

## 2022-04-07 NOTE — Progress Notes (Signed)
   04/07/22 0900  Psych Admission Type (Psych Patients Only)  Admission Status Involuntary  Psychosocial Assessment  Patient Complaints Anxiety;Sleep disturbance  Eye Contact Fair  Facial Expression Animated  Affect Appropriate to circumstance  Speech Logical/coherent  Interaction Assertive  Motor Activity Fidgety  Appearance/Hygiene Improved  Behavior Characteristics Cooperative;Appropriate to situation  Mood Anxious;Pleasant  Thought Process  Coherency WDL  Content WDL  Delusions None reported or observed  Perception WDL  Hallucination None reported or observed  Judgment Poor  Confusion None  Danger to Self  Current suicidal ideation? Denies  Danger to Others  Danger to Others None reported or observed

## 2022-04-07 NOTE — Progress Notes (Signed)
   04/07/22 0539  15 Minute Checks  Location Bedroom  Visual Appearance Calm  Behavior Composed  Sleep (Behavioral Health Patients Only)  Calculate sleep? (Click Yes once per 24 hr at 0600 safety check) Yes  Documented sleep last 24 hours 3.5

## 2022-04-07 NOTE — Group Note (Signed)
Date:  04/07/2022 Time:  10:24 PM  Group Topic/Focus:  Wrap-Up Group:   The focus of this group is to help patients review their daily goal of treatment and discuss progress on daily workbooks.    Participation Level:  Active  Participation Quality:  Appropriate  Affect:  Appropriate  Cognitive:  Appropriate  Insight: Appropriate  Engagement in Group:  Engaged  Modes of Intervention:  Education and Exploration  Additional Comments:  ;Patient attended and participated in group tonight.  She reports that her goal for today was to get more sleep and not to get frustrated and angry. She has been sleeping on and off today.  Salley Scarlet [facility] 04/07/2022, 10:24 PM

## 2022-04-08 DIAGNOSIS — F312 Bipolar disorder, current episode manic severe with psychotic features: Secondary | ICD-10-CM | POA: Diagnosis not present

## 2022-04-08 NOTE — Progress Notes (Signed)
Adult Psychoeducational Group Note  Date:  04/08/2022 Time:  9:18 PM  Group Topic/Focus:  Wrap-Up Group:   The focus of this group is to help patients review their daily goal of treatment and discuss progress on daily workbooks.  Participation Level:  Active  Participation Quality:  Appropriate  Affect:  Appropriate  Cognitive:  Appropriate  Insight: Appropriate  Engagement in Group:  Engaged  Modes of Intervention:  Discussion  Additional Comments:   Pt states that she's had a good day and is hoping that she can get a good nights sleep tonight. Pt expressed excitement about her marriage and her pregnancy and states she's hopeful for life post D/C. Pt states that she's learned to listen more and be more receptive to help. Pt denied everything.  [name] 04/08/2022, 9:18 PM

## 2022-04-08 NOTE — Care Management (Signed)
Requested SW to obtain a PCP and OBGYN appt to be scheduled after discharge from this hospital, as the pt is pregnant. SW said they can get PCP but for the OBGYN appt, it must be a referral from PCP or from here. I will make ambulatory referral to OBGYN today. Susan Davis

## 2022-04-08 NOTE — Progress Notes (Signed)
Adult Psychoeducational Group Note  Date:  04/08/2022 Time:  9:21 AM  Group Topic/Focus:  Goals Group:   The focus of this group is to help patients establish daily goals to achieve during treatment and discuss how the patient can incorporate goal setting into their daily lives to aide in recovery.  Participation Level:  Active  Participation Quality:  Attentive  Affect:  Appropriate  Cognitive:  Appropriate  Insight: Appropriate  Engagement in Group:  Engaged  Modes of Intervention:  Discussion  Additional Comments: The patient engage in group with peers.  Nash Shearer 04/08/2022, 9:21 AM
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
## 2022-04-08 NOTE — Progress Notes (Signed)
   04/08/22 0541  15 Minute Checks  Location Bedroom  Visual Appearance Calm  Behavior Sleeping  Sleep (Behavioral Health Patients Only)  Calculate sleep? (Click Yes once per 24 hr at 0600 safety check) Yes  Documented sleep last 24 hours 7.5

## 2022-04-08 NOTE — Group Note (Signed)
Recreation Therapy Group Note   Group Topic:Personal Development  Group Date: 04/08/2022 Start Time: 1020 End Time: 1105 Facilitators: Tian Mcmurtrey-McCall, LRT,CTRS Location: [address] Dayroom   Goal Area(s) Addresses:  Patient will successfully identify what triggers them. Patient will successfully identify ways they can deal with triggers.     Group Description: Triggers.  Patients were to identify what a trigger is and the problems they have caused. Patients were to then identify their three biggest triggers, strategies they use to avoid/reduce exposure to them and lastly, strategies they use to deal with triggers head on. LRT and patients went on to discuss what other methods could be used when dealing with triggers if the initial strategy didn't work.    Affect/Mood: Flat   Participation Level: Engaged   Participation Quality: Independent   Behavior: Appropriate and Tired   Speech/Thought Process: Focused   Insight: Good   Judgement: Good   Modes of Intervention: Worksheet   Patient Response to Interventions:  Engaged   Education Outcome:  Acknowledges education and In group clarification offered    Clinical Observations/Individualized Feedback: Pt came to group a little late but was brought up to speed by LRT.  Pt appeared a little tired but engaged in group.  Pt expressed her triggers contribute to her anxiety, depression, fear, manic episodes and not eating.  Pt identified biggest triggers as people assuming things about her, rude and hateful people. Pt identified trying to understand them and listening to them more as ways to avoid/reduce exposure to them.  Lastly, pt deals with triggers head on by breathing, prayer and communicating when something isn't right. Pt also encouraged peer during group to focus on herself and not the people who have done wrong by her because peer was always talking about what others have done and not how she can overcome them.      Plan: Continue to engage patient in RT group sessions 2-3x/week.   Torah Pinnock-McCall, LRT,CTRS 04/08/2022 11:23 AM

## 2022-04-09 DIAGNOSIS — F312 Bipolar disorder, current episode manic severe with psychotic features: Secondary | ICD-10-CM | POA: Diagnosis not present

## 2022-04-09 NOTE — Progress Notes (Signed)
   04/09/22 1013  Psych Admission Type (Psych Patients Only)  Admission Status Involuntary  Psychosocial Assessment  Patient Complaints None  Eye Contact Fair  Facial Expression Animated  Affect Appropriate to circumstance  Speech Logical/coherent  Interaction Assertive  Motor Activity Other (Comment)  Appearance/Hygiene In scrubs  Behavior Characteristics Cooperative;Appropriate to situation  Mood Pleasant  Thought Process  Coherency WDL  Content WDL  Delusions None reported or observed  Perception WDL  Hallucination None reported or observed  Judgment Poor  Confusion None  Danger to Self  Current suicidal ideation? Denies  Danger to Others  Danger to Others None reported or observed

## 2022-04-09 NOTE — Progress Notes (Signed)
   04/08/22 2050  Psych Admission Type (Psych Patients Only)  Admission Status Involuntary  Psychosocial Assessment  Patient Complaints None  Eye Contact Fair  Facial Expression Animated  Affect Appropriate to circumstance  Speech Logical/coherent  Interaction Assertive  Motor Activity Other (Comment)  Appearance/Hygiene In scrubs  Behavior Characteristics Cooperative  Mood Pleasant  Thought Process  Coherency WDL  Content WDL  Delusions None reported or observed  Perception WDL  Hallucination None reported or observed  Judgment Poor  Confusion None  Danger to Others  Danger to Others None reported or observed

## 2022-04-09 NOTE — Group Note (Signed)
Date:  04/09/2022 Time:  10:20 AM  Group Topic/Focus:  Goals Group:   The focus of this group is to help patients establish daily goals to achieve during treatment and discuss how the patient can incorporate goal setting into their daily lives to aide in recovery. Orientation:   The focus of this group is to educate the patient on the purpose and policies of crisis stabilization and provide a format to answer questions about their admission.  The group details unit policies and expectations of patients while admitted.    Participation Level:  Active  Participation Quality:  Appropriate  Affect:  Appropriate  Cognitive:  Appropriate  Insight: Appropriate  Engagement in Group:  Engaged  Modes of Intervention:  Discussion  Additional Comments:  Patient attended morning orientation/goal group and said that her goal for today is to be calm.  Novah Nessel W Gaynor Genco [id number], 10:20 AM

## 2022-04-09 NOTE — Group Note (Signed)
Recreation Therapy Group Note   Group Topic:Health and Wellness  Group Date: 04/09/2022 Start Time: [id number] End Time: 1110 Facilitators: Phillippa Straub-McCall, LRT,CTRS Location: [address] Dayroom   Goal Area(s) Addresses:  Patient will define components of whole wellness. Patient will verbalize benefit of whole wellness.  Group Description: Exercise.  LRT and patients discussed the importance of wellness and how the elements of wellness (mental, spiritual and physical) work together.  LRT instructed patients they were going to participate in an exercise group.  Patients were informed they would take turns leading the group in various exercises.  Patients were informed to take breaks and get water as needed.   Affect/Mood: Appropriate   Participation Level: Engaged   Participation Quality: Independent   Behavior: Appropriate   Speech/Thought Process: Focused   Insight: Good   Judgement: Good   Modes of Intervention: Music   Patient Response to Interventions:  Engaged   Education Outcome:  Acknowledges education and In group clarification offered    Clinical Observations/Individualized Feedback: Pt was bright and engaged throughout group.  Pt slowed down and took pauses as needed.  Pt was social with peers and seemed to enjoy herself.  Pt led group in arm circles, side bends (elbow to knee) and bicycles. Pt was appropriate throughout group session.     Plan: Continue to engage patient in RT group sessions 2-3x/week.   Ngozi Alvidrez-McCall, LRT,CTRS 04/09/2022 12:02 PM
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
## 2022-04-09 NOTE — Group Note (Signed)
Date:  04/09/2022 Time:  8:31 PM  Group Topic/Focus:  Wrap-Up Group:   The focus of this group is to help patients review their daily goal of treatment and discuss progress on daily workbooks.    Participation Level:  Active  Participation Quality:  Appropriate  Affect:  Appropriate  Cognitive:  Appropriate  Insight: Appropriate  Engagement in Group:  Engaged  Modes of Intervention:  Education and Exploration  Additional Comments:  Patient attended and participated in group tonight. She reports that today in group she learn to be silent.  Salley Scarlet [facility] 04/09/2022, 8:31 PM

## 2022-04-09 NOTE — Group Note (Signed)
Date:  04/09/2022 Time:  11:08 AM  Group Topic/Focus:  Managing Feelings:   The focus of this group is to identify what feelings patients have difficulty handling and develop a plan to handle them in a healthier way upon discharge.    Participation Level:  Active  Participation Quality:  Appropriate  Affect:  Appropriate  Cognitive:  Appropriate  Insight: Appropriate  Engagement in Group:  Engaged  Modes of Intervention:  Discussion  Additional Comments: Patient had music therapy while having snack.   Yvett Rossel W Siobahn Worsley [id number], 11:08 AM

## 2022-04-10 ENCOUNTER — Encounter (HOSPITAL_COMMUNITY): Payer: Self-pay

## 2022-04-10 DIAGNOSIS — F312 Bipolar disorder, current episode manic severe with psychotic features: Secondary | ICD-10-CM | POA: Diagnosis not present

## 2022-04-10 NOTE — Plan of Care (Signed)
  Problem: Education: Goal: Mental status will improve Outcome: Progressing Goal: Verbalization of understanding the information provided will improve Outcome: Progressing   Problem: Activity: Goal: Interest or engagement in activities will improve Outcome: Progressing Goal: Sleeping patterns will improve Outcome: Progressing   Problem: Coping: Goal: Ability to verbalize frustrations and anger appropriately will improve Outcome: Progressing Goal: Ability to demonstrate self-control will improve Outcome: Progressing   

## 2022-04-10 NOTE — Group Note (Signed)
Date:  04/10/2022 Time:  9:30 AM  Group Topic/Focus:  Goals Group:   The focus of this group is to help patients establish daily goals to achieve during treatment and discuss how the patient can incorporate goal setting into their daily lives to aide in recovery. Orientation:   The focus of this group is to educate the patient on the purpose and policies of crisis stabilization and provide a format to answer questions about their admission.  The group details unit policies and expectations of patients while admitted.    Participation Level:  Active  Participation Quality:  Appropriate  Affect:  Appropriate  Cognitive:  Appropriate  Insight: Appropriate  Engagement in Group:  Engaged  Modes of Intervention:  Discussion  Additional Comments:  Pt wants to focus on staying calm  and listening to Dr.  Garvin Fila 04/10/2022, 9:30 AM

## 2022-04-10 NOTE — Group Note (Signed)
Date:  04/10/2022 Time:  9:21 PM  Group Topic/Focus:  Wrap-Up Group:   The focus of this group is to help patients review their daily goal of treatment and discuss progress on daily workbooks.    Participation Level:  Active  Participation Quality:  Appropriate  Affect:  Appropriate  Cognitive:  Appropriate  Insight: Appropriate  Engagement in Group:  Engaged  Modes of Intervention:  Education and Exploration  Additional Comments:  Patient attended and participated in group tonight. She reports the significant thing that happened today was that she got her discharge date.. she has been having great night sleep.  Susan Davis [facility] 04/10/2022, 9:21 PM

## 2022-04-10 NOTE — Progress Notes (Signed)
   04/09/22 2030  Psych Admission Type (Psych Patients Only)  Admission Status Involuntary  Psychosocial Assessment  Eye Contact Fair  Facial Expression Animated  Affect Appropriate to circumstance  Speech Logical/coherent  Interaction Assertive  Motor Activity Other (Comment)  Appearance/Hygiene In scrubs  Behavior Characteristics Cooperative;Appropriate to situation  Mood Pleasant  Thought Process  Coherency WDL  Content WDL  Delusions None reported or observed  Perception WDL  Hallucination None reported or observed  Judgment Poor  Confusion None  Danger to Others  Danger to Others None reported or observed

## 2022-04-10 NOTE — Progress Notes (Signed)
   04/10/22 1200  Psych Admission Type (Psych Patients Only)  Admission Status Involuntary  Psychosocial Assessment  Patient Complaints None  Eye Contact Fair  Facial Expression Animated  Affect Appropriate to circumstance  Speech Logical/coherent  Interaction Assertive  Motor Activity Other (Comment) (Q 15 min safety checks)  Appearance/Hygiene In scrubs  Behavior Characteristics Cooperative;Appropriate to situation  Mood Pleasant  Thought Process  Coherency WDL  Content WDL  Delusions None reported or observed  Perception WDL  Hallucination None reported or observed  Judgment Poor  Confusion None  Danger to Self  Current suicidal ideation? Denies  Danger to Others  Danger to Others None reported or observed

## 2022-04-10 NOTE — Progress Notes (Signed)
Adult Psychoeducational Group Note  Date:  04/10/2022 Time:  2:27 PM  Group Topic/Focus:  Healthy Communication:   The focus of this group is to discuss communication, barriers to communication, as well as healthy ways to communicate with others.  Participation Level:  Active  Participation Quality:  Appropriate  Affect:  Appropriate  Cognitive:  Appropriate  Insight: Appropriate  Engagement in Group:  Engaged  Modes of Intervention:  Discussion  Additional Comments:  The patient  engaged in group  Susan Davis 04/10/2022, 2:27 PM

## 2022-04-10 NOTE — [facility] Counselor (Signed)
[facility] LCSW Progress Note   04/10/2022    [id number] PM  Susan Davis   [id number]   Type of Contact and Topic:  Food Insecurity Resources  Patient expressed needing additional resources for food pantries and hot meals in the community.  CSW provided list of food pantries, hot meals and education on how to apply for EBT benefits with the application.  Patient appreciative of resources.     Signed:  Riki Altes MSW, LCSW, LCAS 04/10/2022 2:30 PM
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
## 2022-04-10 NOTE — Plan of Care (Signed)
  Problem: Education: Goal: Emotional status will improve Outcome: Progressing Goal: Mental status will improve Outcome: Progressing Goal: Verbalization of understanding the information provided will improve Outcome: Progressing   Problem: Coping: Goal: Ability to demonstrate self-control will improve Outcome: Progressing   

## 2022-04-10 NOTE — Progress Notes (Deleted)
   04/09/22 2020  Precautions / Armbands  Precautions None  Patient armbands applied: Patient Identification Susan Davis)  [facility] Fall Risk Assessment  Risk Factor Category (scoring not indicated) Not Applicable  Age 27  Mental Status 0  Physical Satus 0  Elimination 0  Sensory Impairments 0  Gait or Balance 0  History or falls in past 6 months 0  Mood Stabilizer Medications 0  Benzodiazepines 2  Narcotics 0  Sedatives/Hypnotics 2  Atypical Anti Psychotics 2  Detox Protocol (Alcohol, Narcotics, etc.) 0  Total Score 6  Patient Fall Risk Level Low fall risk  Required Bundle Interventions *See Row Information* Low fall risk - low requirements implemented  Fall intervention(s) refused/Patient educated regarding refusal Nonskid socks  Safe Patient Handling Required Documentation-Repositioning Needs  Assist with movement in bed No  Fragile skin with pressure ulcer No  Unresponsive No  Safe Patient Handling Assessment  Ambulates independently Yes, no lift needed  Safety Interventions  Less Restrictive Interventions Active listening  Safe Environment  Patient oriented to unit and equipment Yes

## 2022-04-10 NOTE — BH IP Treatment Plan (Signed)
Interdisciplinary Treatment and Diagnostic Plan Update  04/10/2022 Time of Session: 10:00 AM (UPDATE) Susan Davis MRN: [id number]  Principal Diagnosis: Severe manic bipolar 1 disorder with psychotic behavior ([place])  Secondary Diagnoses: Principal Problem:   Severe manic bipolar 1 disorder with psychotic behavior ([place]) Active Problems:   GAD (generalized anxiety disorder)   PTSD (post-traumatic stress disorder)   Cannabis use disorder   Alcohol abuse   Current Medications:  Current Facility-Administered Medications  Medication Dose Route Frequency Provider Last Rate Last Admin   acetaminophen (TYLENOL) tablet 650 mg  650 mg Oral Q6H PRN Motley-Mangrum, Jadeka A, PMHNP   650 mg at 04/05/22 0300   alum & mag hydroxide-simeth (MAALOX/MYLANTA) 200-200-20 MG/5ML suspension 30 mL  30 mL Oral Q4H PRN Motley-Mangrum, Jadeka A, PMHNP       diphenhydrAMINE (BENADRYL) capsule 25 mg  25 mg Oral Q6H PRN Motley-Mangrum, Jadeka A, PMHNP   25 mg at 04/06/22 0350   diphenhydrAMINE (BENADRYL) capsule 50 mg  50 mg Oral TID PRN Motley-Mangrum, Jadeka A, PMHNP       Or   diphenhydrAMINE (BENADRYL) injection 50 mg  50 mg Intramuscular TID PRN Motley-Mangrum, Jadeka A, PMHNP       doxylamine (Sleep) (UNISOM) tablet 25 mg  25 mg Oral QHS PRN Massengill, Ovid Curd, MD   25 mg at 04/09/22 2148   haloperidol (HALDOL) tablet 5 mg  5 mg Oral TID PRN Motley-Mangrum, Jadeka A, PMHNP       Or   haloperidol lactate (HALDOL) injection 5 mg  5 mg Intramuscular TID PRN Motley-Mangrum, Jadeka A, PMHNP       magnesium hydroxide (MILK OF MAGNESIA) suspension 30 mL  30 mL Oral Daily PRN Motley-Mangrum, Jadeka A, PMHNP       melatonin tablet 5 mg  5 mg Oral QHS Massengill, Nathan, MD   5 mg at 04/09/22 2100   metFORMIN (GLUCOPHAGE-XR) 24 hr tablet 500 mg  500 mg Oral Q breakfast Massengill, Nathan, MD   500 mg at 04/10/22 0803   OLANZapine zydis (ZYPREXA) disintegrating tablet 20 mg  20 mg Oral QHS Merrily Brittle, DO   20  mg at 04/09/22 2100   prenatal multivitamin tablet 1 tablet  1 tablet Oral Q1200 Massengill, Ovid Curd, MD   1 tablet at 04/10/22 1135   thiamine (Vitamin B-1) tablet 100 mg  100 mg Oral Daily Massengill, Ovid Curd, MD   100 mg at 04/10/22 0803   PTA Medications: Medications Prior to Admission  Medication Sig Dispense Refill Last Dose   benztropine (COGENTIN) 0.5 MG tablet Take 1 tablet (0.5 mg total) by mouth 2 (two) times daily. 60 tablet 3    Dextromethorphan-quiNIDine (NUEDEXTA) 20-10 MG capsule Take 1 capsule by mouth 2 (two) times daily. (Patient not taking: Reported on 04/01/2022) 60 capsule 2    divalproex (DEPAKOTE) 500 MG DR tablet Take 1 tablet (500 mg total) by mouth 2 (two) times daily in the morning and at bedtime.. 60 tablet 3    OLANZapine (ZYPREXA) 10 MG tablet Take 1 tablet (10 mg total) by mouth at bedtime. 30 tablet 3    omega-3 acid ethyl esters (LOVAZA) 1 g capsule Take 1 capsule (1 g total) by mouth 2 (two) times daily. (Patient not taking: Reported on 04/01/2022) 60 capsule 5     Patient Stressors: Educational concerns   Medication change or noncompliance   Substance abuse   Traumatic event    Patient Strengths: Capable of independent living  Electronics engineer  Religious Affiliation  Supportive family/friends  Work skills   Treatment Modalities: Medication Management, Group therapy, Case management,  1 to 1 session with clinician, Psychoeducation, Recreational therapy.   Physician Treatment Plan for Primary Diagnosis: Severe manic bipolar 1 disorder with psychotic behavior ([name]) Long Term Goal(s): Improvement in symptoms so as ready for discharge   Short Term Goals: Ability to identify changes in lifestyle to reduce recurrence of condition will improve Ability to verbalize feelings will improve Ability to disclose and discuss suicidal ideas Ability to demonstrate self-control will improve Ability to identify and develop effective coping  behaviors will improve Compliance with prescribed medications will improve Ability to identify triggers associated with substance abuse/mental health issues will improve  Medication Management: Evaluate patient's response, side effects, and tolerance of medication regimen.  Therapeutic Interventions: 1 to 1 sessions, Unit Group sessions and Medication administration.  Evaluation of Outcomes: Progressing  Physician Treatment Plan for Secondary Diagnosis: Principal Problem:   Severe manic bipolar 1 disorder with psychotic behavior ([name]) Active Problems:   GAD (generalized anxiety disorder)   PTSD (post-traumatic stress disorder)   Cannabis use disorder   Alcohol abuse  Long Term Goal(s): Improvement in symptoms so as ready for discharge   Short Term Goals: Ability to identify changes in lifestyle to reduce recurrence of condition will improve Ability to verbalize feelings will improve Ability to disclose and discuss suicidal ideas Ability to demonstrate self-control will improve Ability to identify and develop effective coping behaviors will improve Compliance with prescribed medications will improve Ability to identify triggers associated with substance abuse/mental health issues will improve     Medication Management: Evaluate patient's response, side effects, and tolerance of medication regimen.  Therapeutic Interventions: 1 to 1 sessions, Unit Group sessions and Medication administration.  Evaluation of Outcomes: Progressing   RN Treatment Plan for Primary Diagnosis: Severe manic bipolar 1 disorder with psychotic behavior ([name]) Long Term Goal(s): Knowledge of disease and therapeutic regimen to maintain health will improve  Short Term Goals: Ability to remain free from injury will improve, Ability to verbalize frustration and anger appropriately will improve, Ability to participate in decision making will improve, Ability to verbalize feelings will improve, Ability to identify  and develop effective coping behaviors will improve, and Compliance with prescribed medications will improve  Medication Management: RN will administer medications as ordered by provider, will assess and evaluate patient's response and provide education to patient for prescribed medication. RN will report any adverse and/or side effects to prescribing provider.  Therapeutic Interventions: 1 on 1 counseling sessions, Psychoeducation, Medication administration, Evaluate responses to treatment, Monitor vital signs and CBGs as ordered, Perform/monitor CIWA, COWS, AIMS and Fall Risk screenings as ordered, Perform wound care treatments as ordered.  Evaluation of Outcomes: Progressing   LCSW Treatment Plan for Primary Diagnosis: Severe manic bipolar 1 disorder with psychotic behavior ([place]) Long Term Goal(s): Safe transition to appropriate next level of care at discharge, Engage patient in therapeutic group addressing interpersonal concerns.  Short Term Goals: Engage patient in aftercare planning with referrals and resources, Increase social support, Increase emotional regulation, Facilitate acceptance of mental health diagnosis and concerns, Identify triggers associated with mental health/substance abuse issues, and Increase skills for wellness and recovery  Therapeutic Interventions: Assess for all discharge needs, 1 to 1 time with Social worker, Explore available resources and support systems, Assess for adequacy in community support network, Educate family and significant other(s) on suicide prevention, Complete Psychosocial Assessment, Interpersonal group therapy.  Evaluation of Outcomes: Progressing  Progress in  Treatment: Attending groups: Yes. Participating in groups: Yes. Taking medication as prescribed: Yes. Toleration medication: Yes. Family/Significant other contact made: Yes, individual(s) contacted:  [name] (mom)- [phone] Patient understands diagnosis: Yes. Discussing patient  identified problems/goals with staff: Yes. Medical problems stabilized or resolved: Yes. Denies suicidal/homicidal ideation: Yes. Issues/concerns per patient self-inventory: No. Other:    New problem(s) identified: No, Describe:  None Reported   New Short Term/Long Term Goal(s): medication stabilization, elimination of SI thoughts, development of comprehensive mental wellness plan.     Patient Goals:  Coping Skills/ Med Management   Discharge Plan or Barriers: Patient recently admitted. CSW will continue to follow and assess for appropriate referrals and possible discharge planning.     Reason for Continuation of Hospitalization: Anxiety Mania Medication stabilization Withdrawal symptoms Depression  Estimated Length of Stay: 3-7 Days   Last 3 [place] Suicide Severity Risk Score: Flowsheet Row Admission (Current) from 04/03/2022 in [facility] [facility] ED from 04/01/2022 in [facility] Department at [facility] Video Visit from 05/01/2020 in [place] No Risk No Risk No Risk       Last PHQ 2/9 Scores:    10/09/2021   12:01 PM 07/05/2021    2:29 PM 01/30/2021    9:41 AM  Depression screen PHQ 2/9  Decreased Interest 2 0 0  Down, Depressed, Hopeless 0 0 2  PHQ - 2 Score 2 0 2  Altered sleeping 2 0 0  Tired, decreased energy 0 0 2  Change in appetite 2 1 2   Feeling bad or failure about yourself  3 1 1   Trouble concentrating 2 0 1  Moving slowly or fidgety/restless 0 0 1  Suicidal thoughts 0 0 0  PHQ-9 Score 11 2 9   Difficult doing work/chores Not difficult at all Not difficult at all Not difficult at all    Scribe for Treatment Team: Charlett Lango 04/10/2022 3:50 PM

## 2022-04-10 NOTE — Progress Notes (Signed)
   04/10/22 1935  Psych Admission Type (Psych Patients Only)  Admission Status Involuntary  Psychosocial Assessment  Patient Complaints None  Eye Contact Fair  Facial Expression Animated  Affect Appropriate to circumstance  Speech Logical/coherent  Interaction Assertive  Motor Activity Other (Comment) (WDL)  Appearance/Hygiene In scrubs  Behavior Characteristics Cooperative;Appropriate to situation  Mood Pleasant;Euthymic  Thought Process  Coherency WDL  Content WDL  Delusions None reported or observed  Perception WDL  Hallucination None reported or observed  Judgment Poor  Confusion None  Danger to Self  Current suicidal ideation? Denies  Agreement Not to Harm Self Yes  Description of Agreement Verbal  Danger to Others  Danger to Others None reported or observed

## 2022-04-10 NOTE — Group Note (Signed)
Recreation Therapy Group Note   Group Topic:Healthy Decision Making  Group Date: 04/10/2022 Start Time: 1020 End Time: 1105 Facilitators: Kaniyah Lisby-McCall, LRT,CTRS Location: 500 Hall Dayroom   Goal Area(s) Addresses:  Patient will effectively work with peer towards shared goal.  Patient will identify factors that guided their decision making.  Patient will pro-socially communicate ideas during group session.   Group Description: Patients were given a scenario that they were going to be stranded on a deserted [place] for several months before being rescued. Writer tasked them with making a list of 15 things they would choose to bring with them for "survival". The list of items was prioritized most important to least. Each patient would come up with their own list, then work together to create a new list of 15 items while in a group of 3-5 peers. LRT discussed each person's list and how it differed from others. The debrief included discussion of priorities, good decisions versus bad decisions, and how it is important to think before acting so we can make the best decision possible. LRT tied the concept of effective communication among group members to patient's support systems outside of the hospital and its benefit post discharge.   Affect/Mood: Appropriate   Participation Level: Engaged   Participation Quality: Independent   Behavior: Appropriate   Speech/Thought Process: Focused   Insight: Good   Judgement: Good   Modes of Intervention: Activity and Music   Patient Response to Interventions:  Engaged   Education Outcome:  Acknowledges education and In group clarification offered    Clinical Observations/Individualized Feedback: Pt was appropriate and engaged throughout group. Pt stated "decisions determine what's going to happen in your life". Pt was focused on the activity and was trying to block out the distraction from her peer.  Pt identified some of the things she  would take with her on the [place] are the bible, water, sunscreen, food, loved ones, fire starter, something to make shelter, gun/knife and clothes. Pt was social with peers. Pt was also able to compile a list with peers as well.     Plan: Continue to engage patient in RT group sessions 2-3x/week.   Susan Davis, LRT,CTRS 04/10/2022 12:24 PM

## 2022-04-11 DIAGNOSIS — F312 Bipolar disorder, current episode manic severe with psychotic features: Secondary | ICD-10-CM | POA: Diagnosis not present

## 2022-04-11 MED ORDER — OLANZAPINE 20 MG PO TABS: 20.0000 mg | ORAL_TABLET | Freq: Every day | ORAL | 0 refills | Status: DC

## 2022-04-11 MED ORDER — PRENATAL MULTIVITAMIN CH: 1.0000 | ORAL_TABLET | Freq: Every day | ORAL | Status: DC

## 2022-04-11 MED ORDER — MELATONIN 5 MG PO TABS: 5.0000 mg | ORAL_TABLET | Freq: Every day | ORAL | 0 refills | Status: DC

## 2022-04-11 MED ORDER — OLANZAPINE 10 MG PO TABS: 20.0000 mg | ORAL_TABLET | Freq: Every day | ORAL | Status: DC

## 2022-04-11 MED ORDER — METFORMIN HCL ER 500 MG PO TB24: 500.0000 mg | ORAL_TABLET | Freq: Every day | ORAL | 0 refills | Status: DC

## 2022-04-11 MED ORDER — DOXYLAMINE SUCCINATE (SLEEP) 25 MG PO TABS: 25.0000 mg | ORAL_TABLET | Freq: Every evening | ORAL | 0 refills | Status: DC | PRN

## 2022-04-11 NOTE — Progress Notes (Signed)
   04/11/22 0630  15 Minute Checks  Location Hallway  Visual Appearance Calm  Behavior Composed  Sleep (Behavioral Health Patients Only)  Calculate sleep? (Click Yes once per 24 hr at 0600 safety check) Yes  Documented sleep last 24 hours 6.75

## 2022-04-11 NOTE — Discharge Instructions (Addendum)
-  Follow-up with your outpatient psychiatric provider -instructions on appointment date, time, and address (location) are provided to you in discharge paperwork.  Follow-up with PCP and OBGYN for pregnancy care.   Call the obgyn and ask for sooner appointment.   If you do not already have a PCP, call the below clinic: [facility]  Phone: [phone]  Address: [address],  [place], [place] [address]   Hours: Monday to Friday; 8:30AM - 5PM   -Take your psychiatric medications as prescribed at discharge - instructions are provided to you in the discharge paperwork  -Follow-up with outpatient primary care doctor and other specialists -for management of preventative medicine and any chronic medical disease.  -Recommend abstinence from alcohol, tobacco, and other illicit drug use at discharge.   -If your psychiatric symptoms recur, worsen, or if you have side effects to your psychiatric medications, call your outpatient psychiatric provider, 911, [phone] or go to the nearest emergency department.  -If suicidal thoughts occur, call your outpatient psychiatric provider, 911, [phone] or go to the nearest emergency department.  Naloxone (Narcan) can help reverse an overdose when given to the victim quickly.  [facility] offers free naloxone kits and instructions/training on its use.  Add naloxone to your first aid kit and you can help save a life.   Pick up your free kit at the following locations:   [place]:  [place], [place]  [id number] [phone]) [facility] [place] [id number] [phone]) [facility] [place]  [place]: [place] [id number] [place] [id number] [phone]) [facility] [place] [address]  [phone])

## 2022-04-11 NOTE — Plan of Care (Signed)
Patient was able to identify triggers for anger within 5 recreation therapy group sessions.  Susan Davis, LRT,CTRS

## 2022-04-11 NOTE — Progress Notes (Signed)
Recreation Therapy Notes  Date: 3.28.24 Time: 1000-1035 Location: 500 Hall Dayroom  Goal Area(s) Addresses:  Patient will successfully identify goals they was to achieve. Patient will discuss what prevents them from reaching their goals.   Group Description: DREAM. Patients were given a worksheet with dream spelled in large block letters.  Patients were to write their goals inside inside of the letters that spell out dream.  Patients were to then write the things that prevent them from reaching their goals outside of the letters.  Patients were provided markers to complete the activity.   Clinical Observations: Pt was bright and engaged.  Pt identified goals as getting a farm, house, be a traveling nurse, clean soul, clean spirit, help others when in need, get closer to God, spreading Jesus love, allow God to have full control and a healthy body/mind. Pt expressed the things stopping her are "me, myself and I".    Humbert Morozov-McCall, LRT,CTRS Obediah Welles A Lylian Sanagustin-McCall 04/11/2022 1:01 PM

## 2022-04-11 NOTE — Progress Notes (Signed)
  [facility] Adult Case Management Discharge Plan :  Will you be returning to the same living situation after discharge:  Yes,  home staying with mother  At discharge, do you have transportation home?: Yes,  taxi provided by hospital Do you have the ability to pay for your medications: Yes,  insurance  Release of information consent forms completed and in the chart;  Patient's signature needed at discharge.  Patient to Follow up at:  [facility] Follow up on 04/15/2022.   Specialty: Behavioral Health Why: You have an appointment for medication management on  04/15/22 at 2:30 pm (this appt will be Virtual). You also have an appointment for therapy services on  05/09/22 at 10:00 am (this appt will be Virtual).  * You are on a cancellation list for a sooner appt. Contact information: [place] [facility]. Go to.   Why: You have a follow up appointment for pregancy confirmation with a nurse on May 7th at 2:15pm.  You will have a follow up appointment with the provider on May 14th at 10:15am. Please call periodically to see if there are any openings for a sooner appointment. Contact information: [address],  [place]  [address] ([phone] ([phone]) [place]. Call today.   Why: call to establish primary care Contact information: [place] [phone]                Next level of care provider has access to [place] and Suicide Prevention discussed: Yes,  mother, [name]     Has patient been referred to the Quitline?: N/A patient is not a smoker  Patient has been referred for addiction treatment: [place], LCSW 04/11/2022, 9:53 AM

## 2022-04-11 NOTE — Progress Notes (Signed)
Patient discharged. Reviewed discharge instructions. Patient verbalized understanding. Patient received all personal belongings including printed prescriptions. Patient left unit at 1052. Left by taxi.
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
## 2022-04-11 NOTE — Progress Notes (Signed)
Recreation Therapy Notes  INPATIENT RECREATION TR PLAN  Patient Details Name: Susan Davis MRN: [id number] DOB: 06/11/1995 Today's Date: 04/11/2022  Rec Therapy Plan Is patient appropriate for Therapeutic Recreation?: Yes Treatment times per week: about 3 days Estimated Length of Stay: 5-7 days TR Treatment/Interventions: Group participation (Comment)  Discharge Criteria Pt will be discharged from therapy if:: Discharged Treatment plan/goals/alternatives discussed and agreed upon by:: Patient/family  Discharge Summary Short term goals set: See patient care plan Short term goals met: Complete Progress toward goals comments: Groups attended Which groups?: Self-esteem, Wellness, Leisure education, Goal setting, Other (Comment) (Decision Making; Personal Development (Triggers)) Reason goals not met: None Therapeutic equipment acquired: N/A Reason patient discharged from therapy: Discharge from hospital Pt/family agrees with progress & goals achieved: Yes Date patient discharged from therapy: 04/11/22   Reyli Schroth-McCall, LRT,CTRS Anola Mcgough A Ezell Melikian-McCall 04/11/2022, 1:13 PM

## 2022-04-11 NOTE — Progress Notes (Signed)
   04/11/22 0920  Psych Admission Type (Psych Patients Only)  Admission Status Involuntary  Psychosocial Assessment  Patient Complaints None  Eye Contact Fair  Facial Expression Animated  Affect Appropriate to circumstance  Speech Logical/coherent  Interaction Assertive  Motor Activity Other (Comment) (WNL)  Appearance/Hygiene In scrubs  Behavior Characteristics Cooperative;Appropriate to situation  Mood Pleasant  Thought Process  Coherency WDL  Content WDL  Delusions None reported or observed  Perception WDL  Hallucination None reported or observed  Judgment Poor  Confusion None  Danger to Self  Current suicidal ideation? Denies  Agreement Not to Harm Self Yes  Description of Agreement verbal  Danger to Others  Danger to Others None reported or observed

## 2022-04-15 ENCOUNTER — Ambulatory Visit (HOSPITAL_COMMUNITY): Payer: Medicaid Other | Admitting: Psychiatry

## 2022-04-15 ENCOUNTER — Encounter (HOSPITAL_COMMUNITY): Payer: Self-pay

## 2022-05-09 ENCOUNTER — Encounter (HOSPITAL_COMMUNITY): Payer: Self-pay

## 2022-05-09 ENCOUNTER — Ambulatory Visit (HOSPITAL_COMMUNITY): Payer: Medicaid Other | Admitting: Licensed Clinical Social Worker

## 2022-05-10 ENCOUNTER — Encounter (HOSPITAL_COMMUNITY): Payer: Self-pay | Admitting: Obstetrics and Gynecology

## 2022-05-10 ENCOUNTER — Telehealth (HOSPITAL_COMMUNITY): Payer: Medicaid Other | Admitting: Psychiatry

## 2022-05-10 ENCOUNTER — Encounter (HOSPITAL_COMMUNITY): Payer: Self-pay

## 2022-05-10 ENCOUNTER — Inpatient Hospital Stay (HOSPITAL_COMMUNITY): Payer: Medicaid Other

## 2022-05-10 ENCOUNTER — Inpatient Hospital Stay (HOSPITAL_COMMUNITY)
Admission: AD | Admit: 2022-05-10 | Discharge: 2022-05-10 | Disposition: A | Discharge status: Home / Self Care | Attending: Obstetrics and Gynecology | Admitting: Obstetrics and Gynecology

## 2022-05-10 DIAGNOSIS — N760 Acute vaginitis: Secondary | ICD-10-CM

## 2022-05-10 DIAGNOSIS — Z3A10 10 weeks gestation of pregnancy: Secondary | ICD-10-CM | POA: Diagnosis not present

## 2022-05-10 DIAGNOSIS — O23591 Infection of other part of genital tract in pregnancy, first trimester: Secondary | ICD-10-CM | POA: Diagnosis not present

## 2022-05-10 DIAGNOSIS — O26891 Other specified pregnancy related conditions, first trimester: Secondary | ICD-10-CM | POA: Insufficient documentation

## 2022-05-10 DIAGNOSIS — B9689 Other specified bacterial agents as the cause of diseases classified elsewhere: Secondary | ICD-10-CM

## 2022-05-10 DIAGNOSIS — R519 Headache, unspecified: Secondary | ICD-10-CM | POA: Insufficient documentation

## 2022-05-10 DIAGNOSIS — O209 Hemorrhage in early pregnancy, unspecified: Secondary | ICD-10-CM | POA: Diagnosis present

## 2022-05-10 DIAGNOSIS — O26851 Spotting complicating pregnancy, first trimester: Secondary | ICD-10-CM | POA: Diagnosis not present

## 2022-05-10 LAB — CBC
HCT: 32.7 % — ABNORMAL LOW (ref 36.0–46.0)
Hemoglobin: 11.6 g/dL — ABNORMAL LOW (ref 12.0–15.0)
MCH: 30.9 pg (ref 26.0–34.0)
MCHC: 35.5 g/dL (ref 30.0–36.0)
MCV: 87 fL (ref 80.0–100.0)
Platelets: 366 10*3/uL (ref 150–400)
RBC: 3.76 MIL/uL — ABNORMAL LOW (ref 3.87–5.11)
RDW: 13.3 % (ref 11.5–15.5)
WBC: 11.4 10*3/uL — ABNORMAL HIGH (ref 4.0–10.5)
nRBC: 0 % (ref 0.0–0.2)

## 2022-05-10 LAB — URINALYSIS, ROUTINE W REFLEX MICROSCOPIC
Bacteria, UA: NONE SEEN
Bilirubin Urine: NEGATIVE
Glucose, UA: NEGATIVE mg/dL
Hgb urine dipstick: NEGATIVE
Ketones, ur: NEGATIVE mg/dL
Nitrite: NEGATIVE
Protein, ur: NEGATIVE mg/dL
Specific Gravity, Urine: 1.005 (ref 1.005–1.030)
pH: 7 (ref 5.0–8.0)

## 2022-05-10 LAB — WET PREP, GENITAL
Sperm: NONE SEEN
Trich, Wet Prep: NONE SEEN
WBC, Wet Prep HPF POC: >=10 — AB (ref <10)
Yeast Wet Prep HPF POC: NONE SEEN

## 2022-05-10 LAB — HIV ANTIBODY (ROUTINE TESTING W REFLEX): HIV Screen 4th Generation wRfx: NONREACTIVE

## 2022-05-10 LAB — ABO/RH: ABO/RH(D): B POS

## 2022-05-10 LAB — HEPATITIS B SURFACE ANTIGEN: Hepatitis B Surface Ag: NONREACTIVE

## 2022-05-10 MED ORDER — LACTATED RINGERS IV BOLUS
1000.0000 mL | Freq: Once | INTRAVENOUS | Status: AC
  Administered 2022-05-10: 1000 mL via INTRAVENOUS

## 2022-05-10 MED ORDER — ACETAMINOPHEN-CAFFEINE 500-65 MG PO TABS
2.0000 | ORAL_TABLET | Freq: Once | ORAL | Status: AC
  Administered 2022-05-10: 2 via ORAL
  Filled 2022-05-10: qty 2

## 2022-05-10 MED ORDER — METRONIDAZOLE 500 MG PO TABS
500.0000 mg | ORAL_TABLET | Freq: Two times a day (BID) | ORAL | 0 refills | Status: DC
  Filled 2022-05-10: qty 14, 7d supply, fill #0

## 2022-05-10 NOTE — MAU Note (Signed)
Officer with patient stated that the patient could only have "hot food" if the patient was admitted. RN printed [employer] [id number] [phone] and highlighted section regarding pregnant female patients that "shall have access to the full range of meal options provided by the hospital to ensure that each meal meets the female incarcerated person's nutritional needs" and took the bill to the patient's room and informed officer. New oncoming office for the 1900 shift stated she would need to call her Luz Brazen. This RN spoke with the Sgt and read the bill. Sgt stated that the patient could have whatever she wanted, hot or cold. Sgt stated "some ADO's do that so that some patient's don't go crazy ordering everything."  CNM notified. Meal ordered from cafeteria for patient.

## 2022-05-10 NOTE — MAU Provider Note (Signed)
History     CSN: [id number]  Arrival date and time: 05/10/22 1758   Event Date/Time   First Provider Initiated Contact with Patient 05/10/22 1849      Chief Complaint  Patient presents with   Vaginal Bleeding   HPI [name] is a 27 y.o. year old G1P0 female at [redacted]w[redacted]d weeks gestation who presents to MAU reporting intermittent, light vaginal spotting x 1 month. She describes the spotting as dark, brown with wiping. She also reports she hit the RT side of her head at 2200 last night and has a knot. She reports a H/A since after lunchtime. She reports she usually gets a H/A with agitation and needing to eat. She reports she didn't have enough to eat at lunch. She last had SI at the time of conception. Her pregnancy is complicated by multiple mental health issues and she is currently incarcerated (since 04/14/2022). She has not started [facility] at this time. Her detention officer, Nurse, learning disability, is present and contributing to the history taking.   OB History     Gravida  1   Para      Term      Preterm      AB      Living         SAB      IAB      Ectopic      Multiple      Live Births              Past Medical History:  Diagnosis Date   Medical history non-contributory     Past Surgical History:  Procedure Laterality Date   NO PAST SURGERIES      History reviewed. No pertinent family history.  Social History   Tobacco Use   Smoking status: Never   Smokeless tobacco: Never  Vaping Use   Vaping Use: Never used  Substance Use Topics   Alcohol use: Not Currently    Comment: "occasionally"    Drug use: Yes    Types: Marijuana    Comment: pt states its been 1-2 months    Allergies:  Allergies  Allergen Reactions   Other Other (See Comments)    Allergic to Unknown Anxiety Medication which causes twitching (2021)  Regarding entry above- is this "Hydroxyzine"?? (2024)   Hydroxyzine Pamoate Swelling, Anxiety and Other (See Comments)     Shaking made feel "like having a panic attack"    Medications Prior to Admission  Medication Sig Dispense Refill Last Dose   metFORMIN (GLUCOPHAGE-XR) 500 MG 24 hr tablet Take 1 tablet (500 mg total) by mouth daily with breakfast. 30 tablet 0 05/10/2022   OLANZapine (ZYPREXA) 20 MG tablet Take 1 tablet (20 mg total) by mouth at bedtime. 30 tablet 0 05/09/2022   Prenatal Vit-Fe Fumarate-FA (PRENATAL MULTIVITAMIN) TABS tablet Take 1 tablet by mouth daily at 12 noon.   05/10/2022   doxylamine, Sleep, (UNISOM) 25 MG tablet Take 1 tablet (25 mg total) by mouth at bedtime as needed for sleep. 30 tablet 0    melatonin 5 MG TABS Take 1 tablet (5 mg total) by mouth at bedtime.  0     Review of Systems  Constitutional: Negative.   HENT: Negative.    Eyes: Negative.   Respiratory: Negative.    Cardiovascular: Negative.   Gastrointestinal: Negative.   Endocrine: Negative.   Genitourinary:  Positive for vaginal bleeding (dark, brown spotting).  Musculoskeletal: Negative.   Skin: Negative.  Allergic/Immunologic: Negative.   Neurological:  Positive for headaches.  Hematological: Negative.   Psychiatric/Behavioral: Negative.     Physical Exam   Blood pressure 122/64, pulse 69, temperature 98.2 F (36.8 C), temperature source Oral, height 4\' 11"  (1.499 m), weight 62.1 kg, last menstrual period 03/01/2022, SpO2 100 %.  Physical Exam Vitals and nursing note reviewed.  Constitutional:      Appearance: Normal appearance. She is normal weight.  Cardiovascular:     Rate and Rhythm: Normal rate.  Pulmonary:     Effort: Pulmonary effort is normal.  Abdominal:     General: Abdomen is flat.     Palpations: Abdomen is soft.  Genitourinary:    General: Normal vulva.     Comments: Pelvic exam: External genitalia normal, SE: vaginal walls pink and well rugated, cervix is smooth, pink, no lesions, small amt of thin, yellowish-white vaginal d/c -- WP, GC/CT done, cervix visually closed, Uterus is  non-tender, no CMT or friability, no adnexal tenderness.  Musculoskeletal:        General: Normal range of motion.  Skin:    General: Skin is warm and dry.  Neurological:     Mental Status: She is alert and oriented to person, place, and time.  Psychiatric:        Mood and Affect: Mood normal.        Behavior: Behavior normal.        Thought Content: Thought content normal.        Judgment: Judgment normal.    FHTs by doppler: 166 bpm  MAU Course  Procedures  MDM CCUA Wet Prep GC/CT -- Results pending   Results for orders placed or performed during the hospital encounter of 05/10/22 (from the past 24 hour(s))  Urinalysis, Routine w reflex microscopic -Urine, Clean Catch     Status: Abnormal   Collection Time: 05/10/22  6:25 PM  Result Value Ref Range   Color, Urine STRAW (A) YELLOW   APPearance CLEAR CLEAR   Specific Gravity, Urine 1.005 1.005 - 1.030   pH 7.0 5.0 - 8.0   Glucose, UA NEGATIVE NEGATIVE mg/dL   Hgb urine dipstick NEGATIVE NEGATIVE   Bilirubin Urine NEGATIVE NEGATIVE   Ketones, ur NEGATIVE NEGATIVE mg/dL   Protein, ur NEGATIVE NEGATIVE mg/dL   Nitrite NEGATIVE NEGATIVE   Leukocytes,Ua TRACE (A) NEGATIVE   RBC / HPF 0-5 0 - 5 RBC/hpf   WBC, UA 0-5 0 - 5 WBC/hpf   Bacteria, UA NONE SEEN NONE SEEN   Squamous Epithelial / HPF 0-5 0 - 5 /HPF  Wet prep, genital     Status: Abnormal   Collection Time: 05/10/22  6:51 PM   Specimen: PATH Cytology Cervicovaginal Ancillary Only  Result Value Ref Range   Yeast Wet Prep HPF POC NONE SEEN NONE SEEN   Trich, Wet Prep NONE SEEN NONE SEEN   Clue Cells Wet Prep HPF POC PRESENT (A) NONE SEEN   WBC, Wet Prep HPF POC >=10 (A) <10   Sperm NONE SEEN   CBC     Status: Abnormal   Collection Time: 05/10/22  7:36 PM  Result Value Ref Range   WBC 11.4 (H) 4.0 - 10.5 K/uL   RBC 3.76 (L) 3.87 - 5.11 MIL/uL   Hemoglobin 11.6 (L) 12.0 - 15.0 g/dL   HCT 16.1 (L) 09.6 - 04.5 %   MCV 87.0 80.0 - 100.0 fL   MCH 30.9 26.0 - 34.0  pg   MCHC 35.5 30.0 - 36.0 g/dL  RDW 13.3 11.5 - 15.5 %   Platelets 366 150 - 400 K/uL   nRBC 0.0 0.0 - 0.2 %  Hepatitis B surface antigen     Status: None   Collection Time: 05/10/22  7:36 PM  Result Value Ref Range   Hepatitis B Surface Ag NON REACTIVE NON REACTIVE  ABO/Rh     Status: None   Collection Time: 05/10/22  7:38 PM  Result Value Ref Range   ABO/RH(D) B POS    No rh immune globuloin      NOT A RH IMMUNE GLOBULIN CANDIDATE, PT RH POSITIVE Performed at [facility], [address], [place], [place] [address]    [place] Comp Less 14 Wks  Result Date: 05/10/2022 CLINICAL DATA:  First trimester pregnancy, vaginal spotting EXAM: OBSTETRIC <14 WK ULTRASOUND TECHNIQUE: Transabdominal ultrasound was performed for evaluation of the gestation as well as the maternal uterus and adnexal regions. COMPARISON:  None Available. FINDINGS: Intrauterine gestational sac: Single Yolk sac:  Seen Embryo:  Seen Cardiac Activity: Seen Heart Rate: 162 bpm CRL:   27.3 mm   9 w 4 d                  [place] EDC: 12/09/2022 Subchorionic hemorrhage:  None visualized. Maternal uterus/adnexae: There is a 1.5 cm hypoechoic structure in the anterior body, possibly small fibroid. Cervix is closed. No adnexal masses are seen. There is no free fluid in pelvis. IMPRESSION: Single live intrauterine pregnancy is seen. Sonographically estimated gestational age is 9 weeks 4 days. There is no demonstrable subchorionic bleed. There is possible small 1.5 cm fibroid in the anterior uterus. Electronically Signed   By: Ernie Avena M.D.   On: 05/10/2022 20:30    Assessment and Plan  1. Bacterial vaginosis - Rx: Flagyl 500 mg BID x 7 days - Information provided on bacterial vaginosis   2. Spotting affecting pregnancy in first trimester - Information provided on vaginal bleeding in pregnancy   3. [redacted] weeks gestation of pregnancy   - Discharge with Officer Romeo Apple back to [facility] - Call [facility] to  schedule prenatal care - Patient verbalized an understanding of the plan of care and agrees.  Raelyn Mora, CNM 05/10/2022, 7:27 PM

## 2022-05-10 NOTE — MAU Note (Addendum)
...  Susan Davis is a 28 y.o. at [redacted]w[redacted]d here in MAU reporting: Intermittent light pink vaginal spotting for the past month. She reports right now she is experiencing a dark brown color when she wipes. She reports last night around 2200 she hit the right side of her head and has a knot on that side. She reports she has a current HA that began after lunch. She reports she gets headaches when she gets agitated and when she doesn't eat. She reports she had lunch but "it was not enough." Denies vaginal itching and vaginal odors.   Patient accompanied by an Technical sales engineer. Patient came from jail. Officer unsure as to why patient is in jail. Patient reports she has been with them since 3/31.  LMP: 03/01/2022 Onset of complaint: Ongoing Pain score: 5/10 HA  FHT: wearing jumpsuit, will attempt in room. Lab orders placed from triage:  UA

## 2022-05-11 ENCOUNTER — Other Ambulatory Visit: Payer: Self-pay

## 2022-05-11 LAB — RPR: RPR Ser Ql: NONREACTIVE

## 2022-05-12 ENCOUNTER — Encounter: Payer: Self-pay | Admitting: Obstetrics and Gynecology

## 2022-05-12 DIAGNOSIS — O09899 Supervision of other high risk pregnancies, unspecified trimester: Secondary | ICD-10-CM | POA: Insufficient documentation

## 2022-05-12 LAB — RUBELLA SCREEN: Rubella: <0.9 index — ABNORMAL LOW (ref >0.99)

## 2022-05-13 ENCOUNTER — Other Ambulatory Visit: Payer: Self-pay

## 2022-05-13 LAB — GC/CHLAMYDIA PROBE AMP (~~LOC~~) NOT AT ARMC
Chlamydia: NEGATIVE
Comment: NEGATIVE
Comment: NORMAL
Neisseria Gonorrhea: NEGATIVE

## 2022-05-21 ENCOUNTER — Other Ambulatory Visit: Payer: Self-pay

## 2022-05-24 ENCOUNTER — Ambulatory Visit (INDEPENDENT_AMBULATORY_CARE_PROVIDER_SITE_OTHER): Payer: Medicaid Other

## 2022-05-24 VITALS — BP 116/72 | HR 74 | Wt 132.8 lb

## 2022-05-24 DIAGNOSIS — Z1339 Encounter for screening examination for other mental health and behavioral disorders: Secondary | ICD-10-CM

## 2022-05-24 DIAGNOSIS — Z3401 Encounter for supervision of normal first pregnancy, first trimester: Secondary | ICD-10-CM | POA: Insufficient documentation

## 2022-05-24 DIAGNOSIS — Z3A11 11 weeks gestation of pregnancy: Secondary | ICD-10-CM

## 2022-05-24 HISTORY — DX: Encounter for supervision of normal first pregnancy, first trimester: Z34.01

## 2022-05-24 NOTE — Progress Notes (Signed)
New OB Intake  I connected with Susan Davis  on 05/24/22 at 10:15 AM EDT by in person Video Visit and verified that I am speaking with the correct person using two identifiers. Nurse is located at [facility] and pt is located at [place].  I discussed the limitations, risks, security and privacy concerns of performing an evaluation and management service by telephone and the availability of in person appointments. I also discussed with the patient that there may be a patient responsible charge related to this service. The patient expressed understanding and agreed to proceed.  I explained I am completing New OB Intake today. We discussed EDD of 12/09/22 that is based on 9 week u/s. Pt is G1/P0. I reviewed her allergies, medications, Medical/Surgical/OB history, and appropriate screenings. I informed her of [facility] services. [facility] information placed in AVS. Based on history, this is a low risk pregnancy.  Patient Active Problem List   Diagnosis Date Noted   Rubella non-immune status, antepartum 05/12/2022   Severe manic bipolar 1 disorder with psychotic behavior (HCC) 04/04/2022   GAD (generalized anxiety disorder) 04/04/2022   PTSD (post-traumatic stress disorder) 04/04/2022   Cannabis use disorder 04/04/2022   Alcohol abuse 04/04/2022   Bipolar disorder, in partial remission, most recent episode manic [facility])    MDD (major depressive disorder) 05/18/2018    Concerns addressed today  Delivery Plans Plans to deliver at [facility]. Patient given information for [facility] Healthy Baby website for more information about [facility]. Patient is not interested in water birth. Offered upcoming OB visit with CNM to discuss further.  MyChart/Babyscripts MyChart access verified. I explained pt will have some visits in office and some virtually. Babyscripts instructions given and order placed. Patient verifies receipt of registration text/e-mail. Account successfully created and app  downloaded.  Blood Pressure Cuff/Weight Scale Patient will have BP managed in [facility]. Explained after first prenatal appt pt will check weekly and document in Babyscripts. Patient will have weight managed in [facility].  Anatomy US Explained first scheduled [place] will be around 19 weeks. Anatomy US TBD.   Labs Discussed Avelina Laine genetic screening with patient. Would like both Panorama and Horizon drawn at new OB visit. Routine prenatal labs needed.  COVID Vaccine Patient has had COVID vaccine.   Is patient a CenteringPregnancy candidate?  Not a Candidate Declined due to  Inmate Not a candidate due to Inmate If accepted,   Social Determinants of Health Food Insecurity: Patient denies food insecurity. WIC Referral: Patient is interested in referral to [facility].  Transportation: Patient denies transportation needs. Childcare: Discussed no children allowed at ultrasound appointments. Offered childcare services; patient declines childcare services at this time.  Interested in [place]? If yes, send referral and doula dot phrase.   First visit review I reviewed new OB appt with patient. I explained they will have a provider visit that includes pap smear and discuss plan of care for pregnancy. Explained pt will be seen by Scheryl Darter at first visit; encounter routed to appropriate provider. Explained that patient will be seen by pregnancy navigator following visit with provider.   Hamilton Capri, RN 05/24/2022  10:41 AM

## 2022-05-26 LAB — URINE CULTURE, OB REFLEX: Organism ID, Bacteria: NO GROWTH

## 2022-05-26 LAB — CULTURE, OB URINE

## 2022-05-28 ENCOUNTER — Encounter: Payer: Self-pay | Admitting: Obstetrics and Gynecology

## 2022-06-01 LAB — PANORAMA PRENATAL TEST FULL PANEL:PANORAMA TEST PLUS 5 ADDITIONAL MICRODELETIONS: FETAL FRACTION: 9.2

## 2022-06-05 LAB — HORIZON CUSTOM: REPORT SUMMARY: NEGATIVE

## 2022-06-11 ENCOUNTER — Telehealth (HOSPITAL_COMMUNITY): Payer: Self-pay | Admitting: Psychiatry

## 2022-06-12 ENCOUNTER — Encounter (HOSPITAL_COMMUNITY): Payer: Self-pay | Admitting: Psychiatry

## 2022-06-12 ENCOUNTER — Other Ambulatory Visit (HOSPITAL_COMMUNITY): Payer: Self-pay | Admitting: Psychiatry

## 2022-06-12 ENCOUNTER — Telehealth (INDEPENDENT_AMBULATORY_CARE_PROVIDER_SITE_OTHER): Payer: Medicaid Other | Admitting: Psychiatry

## 2022-06-12 ENCOUNTER — Telehealth (HOSPITAL_COMMUNITY): Payer: Medicaid Other | Admitting: Psychiatry

## 2022-06-12 ENCOUNTER — Encounter (HOSPITAL_COMMUNITY): Payer: Self-pay

## 2022-06-12 DIAGNOSIS — F3173 Bipolar disorder, in partial remission, most recent episode manic: Secondary | ICD-10-CM

## 2022-06-12 MED ORDER — OLANZAPINE 20 MG PO TABS
20.0000 mg | ORAL_TABLET | Freq: Every day | ORAL | 3 refills | Status: DC
Start: 2022-06-12 — End: 2022-06-12
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
## 2022-06-13 ENCOUNTER — Other Ambulatory Visit (HOSPITAL_COMMUNITY): Payer: Self-pay | Admitting: Psychiatry

## 2022-06-13 DIAGNOSIS — F3173 Bipolar disorder, in partial remission, most recent episode manic: Secondary | ICD-10-CM

## 2022-06-24 ENCOUNTER — Encounter: Payer: Self-pay | Admitting: Obstetrics & Gynecology

## 2022-06-24 ENCOUNTER — Other Ambulatory Visit: Payer: Self-pay

## 2022-06-24 ENCOUNTER — Ambulatory Visit (INDEPENDENT_AMBULATORY_CARE_PROVIDER_SITE_OTHER): Payer: Medicaid Other | Admitting: Obstetrics & Gynecology

## 2022-06-24 ENCOUNTER — Other Ambulatory Visit (HOSPITAL_COMMUNITY)
Admission: RE | Admit: 2022-06-24 | Discharge: 2022-06-24 | Disposition: A | Discharge status: Home / Self Care | Payer: Medicaid Other | Source: Ambulatory Visit | Attending: Obstetrics & Gynecology | Admitting: Obstetrics & Gynecology

## 2022-06-24 VITALS — BP 110/68 | HR 79 | Wt 149.4 lb

## 2022-06-24 DIAGNOSIS — F1010 Alcohol abuse, uncomplicated: Secondary | ICD-10-CM

## 2022-06-24 DIAGNOSIS — F1290 Cannabis use, unspecified, uncomplicated: Secondary | ICD-10-CM

## 2022-06-24 DIAGNOSIS — F3173 Bipolar disorder, in partial remission, most recent episode manic: Secondary | ICD-10-CM

## 2022-06-24 DIAGNOSIS — Z3401 Encounter for supervision of normal first pregnancy, first trimester: Secondary | ICD-10-CM | POA: Diagnosis not present

## 2022-06-24 DIAGNOSIS — F312 Bipolar disorder, current episode manic severe with psychotic features: Secondary | ICD-10-CM

## 2022-06-24 MED ORDER — PRENATAL 27-1 MG PO TABS
1.0000 | ORAL_TABLET | Freq: Every day | ORAL | 12 refills | Status: DC
Start: 2022-06-24 — End: 2023-01-09

## 2022-06-24 MED ORDER — PRENATAL MULTIVITAMIN CH
1.0000 | ORAL_TABLET | Freq: Every day | ORAL | Status: DC
Start: 2022-06-24 — End: 2022-07-22

## 2022-06-24 MED ORDER — PRENATAL PLUS 27-1 MG PO TABS
1.0000 | ORAL_TABLET | Freq: Every day | ORAL | 11 refills | Status: DC
Start: 2022-06-24 — End: 2022-06-24

## 2022-06-24 NOTE — Progress Notes (Signed)
  Subjective:    Susan Davis is a G1P0 [redacted]w[redacted]d being seen today for her first obstetrical visit.  Her obstetrical history is significant for  h/o psych. D/o . Patient does intend to breast feed. Pregnancy history fully reviewed.  Patient reports no complaints.  Vitals:   06/24/22 1057  BP: 110/68  Pulse: 79  Weight: 149 lb 6.4 oz (67.8 kg)    HISTORY: OB History  Gravida Para Term Preterm AB Living  1            SAB IAB Ectopic Multiple Live Births               # Outcome Date GA Lbr Len/2nd Weight Sex Delivery Anes PTL Lv  1 Current            Past Medical History:  Diagnosis Date   Anxiety    Bipolar 1 disorder (HCC)    Depression    Medical history non-contributory    Past Surgical History:  Procedure Laterality Date   NO PAST SURGERIES     Family History  Problem Relation Age of Onset   Hypertension Mother    Diabetes Mother      Exam    Uterus:   61  Pelvic Exam:    Perineum: No Hemorrhoids   Vulva: normal   Vagina:  normal mucosa   pH:    Cervix: No lesion   Adnexa: not evaluated   Bony Pelvis: average  System: Breast:  Inspection negative   Skin: normal coloration and turgor, no rashes    Neurologic: oriented, normal mood   Extremities: no deformities   HEENT PERRLA   Mouth/Teeth mucous membranes moist, pharynx normal without lesions and dental hygiene good   Neck supple   Cardiovascular: regular rate and rhythm, no murmurs or gallops   Respiratory:  appears well, vitals normal, no respiratory distress, acyanotic, normal RR, chest clear, no wheezing, crepitations, rhonchi, normal symmetric air entry   Abdomen: soft, non-tender; bowel sounds normal; no masses,  no organomegaly   Urinary: urethral meatus normal      Assessment:    Pregnancy: G1P0 Patient Active Problem List   Diagnosis Date Noted   Encounter for supervision of normal first pregnancy in first trimester 05/24/2022   Rubella non-immune status, antepartum 05/12/2022    Severe manic bipolar 1 disorder with psychotic behavior (HCC) 04/04/2022   GAD (generalized anxiety disorder) 04/04/2022   PTSD (post-traumatic stress disorder) 04/04/2022   Cannabis use disorder 04/04/2022   Alcohol abuse 04/04/2022   Bipolar disorder, in partial remission, most recent episode manic (HCC)    MDD (major depressive disorder) 05/18/2018        Plan:     Initial labs drawn. Prenatal vitamins. Problem list reviewed and updated. Genetic Screening discussed : ordered.  Ultrasound discussed; fetal survey: ordered.  Follow up in 4 weeks. 50% of 30 min visit spent on counseling and coordination of care.     Scheryl Darter 06/24/2022

## 2022-06-24 NOTE — Progress Notes (Deleted)
  Subjective:    Susan Davis is a G1P0 [redacted]w[redacted]d being seen today for her first obstetrical visit.  Her obstetrical history is significant for {ob risk factors:10154}. Patient {does/does not:19097} intend to breast feed. Pregnancy history fully reviewed.  Patient reports {sx:14538}.  There were no vitals filed for this visit.  HISTORY: OB History  Gravida Para Term Preterm AB Living  1            SAB IAB Ectopic Multiple Live Births               # Outcome Date GA Lbr Len/2nd Weight Sex Delivery Anes PTL Lv  1 Current            Past Medical History:  Diagnosis Date   Anxiety    Bipolar 1 disorder (HCC)    Depression    Medical history non-contributory    Past Surgical History:  Procedure Laterality Date   NO PAST SURGERIES     Family History  Problem Relation Age of Onset   Hypertension Mother    Diabetes Mother      Exam    Uterus:     Pelvic Exam:    Perineum: {Exam; anus and perineum:14598}   Vulva: {vulva exam:14487}   Vagina:  {vaginal exam:14498}   pH: ***   Cervix: {cervix exam:14595}   Adnexa: {adnexa:12223}   Bony Pelvis: {desc; pelvis:14491}  System: Breast:  {Exam; breast:13139::"normal appearance, no masses or tenderness"}   Skin: {pe skin brief QI:696295}    Neurologic: {Exam; neuro:16375}   Extremities: {Exam; extremities:15096}   HEENT {exam; MWUXL:24401}   Mouth/Teeth {pe mouth simple ob:314450}   Neck {Neck (ob):32092}   Cardiovascular: {HEART EXAM HEM/ONC:21750}   Respiratory:  {Exam; respiratory:5782}   Abdomen: {Exam; abdomen:16834}   Urinary: {exam; urinary female:30845}      Assessment:    Pregnancy: G1P0 Patient Active Problem List   Diagnosis Date Noted   Encounter for supervision of normal first pregnancy in first trimester 05/24/2022   Rubella non-immune status, antepartum 05/12/2022   Severe manic bipolar 1 disorder with psychotic behavior (HCC) 04/04/2022   GAD (generalized anxiety disorder) 04/04/2022   PTSD  (post-traumatic stress disorder) 04/04/2022   Cannabis use disorder 04/04/2022   Alcohol abuse 04/04/2022   Bipolar disorder, in partial remission, most recent episode manic (HCC)    MDD (major depressive disorder) 05/18/2018        Plan:     Initial labs drawn. Prenatal vitamins. Problem list reviewed and updated. Genetic Screening discussed {GENETIC SCREENING TEST:22046}: {requests/ordered/declines:14581}.  Ultrasound discussed; fetal survey: {requests/ordered/declines:14581}.  Follow up in {numbers 0-4:31231} weeks. ***% of *** min visit spent on counseling and coordination of care.  ***   Scheryl Darter 06/24/2022

## 2022-06-24 NOTE — Progress Notes (Addendum)
   PRENATAL VISIT NOTE  Subjective:  Susan Davis is a 27 y.o. G1P0 at [redacted]w[redacted]d being seen today for ongoing prenatal care.  She is currently monitored for the following issues for this low-risk pregnancy and has MDD (major depressive disorder); Bipolar disorder, in partial remission, most recent episode manic (HCC); Severe manic bipolar 1 disorder with psychotic behavior (HCC); GAD (generalized anxiety disorder); PTSD (post-traumatic stress disorder); Cannabis use disorder; Alcohol abuse; Rubella non-immune status, antepartum; and Encounter for supervision of normal first pregnancy in first trimester on their problem list.  Patient reports no complaints.  Contractions: Not present. Vag. Bleeding: None.  Movement: Absent. Denies leaking of fluid.   The following portions of the patient's history were reviewed and updated as appropriate: allergies, current medications, past family history, past medical history, past social history, past surgical history and problem list.   Objective:   Vitals:   06/24/22 1057  BP: 110/68  Pulse: 79  Weight: 149 lb 6.4 oz (67.8 kg)    Fetal Status: Fetal Heart Rate (bpm): 156   Movement: Absent     General:  Alert, oriented and cooperative. Patient is in no acute distress.  Skin: Skin is warm and dry. No rash noted.   Cardiovascular: Normal heart rate noted  Respiratory: Normal respiratory effort, no problems with respiration noted  Abdomen: Soft, gravid, appropriate for gestational age.  Pain/Pressure: Absent     Pelvic: Cervical exam performed in the presence of a chaperone Norma; appearing vulva, vaginal canal with no tenderness, lesions or masses. Normal appearing cervix with appropriate amount of discharge noted at the os.       Extremities: Normal range of motion.  Edema: None  Mental Status: Normal mood and affect. Normal behavior. Normal judgment and thought content.   Assessment and Plan:  Pregnancy: G1P0 at [redacted]w[redacted]d 1. Encounter for  supervision of normal first pregnancy in first trimester - Ambulatory referral to Integrated [facility]al [facility] - Cytology - PAP( [facility]) - AFP, Serum, Open Spina Bifida - Prenatal Vit-Fe Fumarate-FA (PRENATAL MULTIVITAMIN) TABS tablet; Take 1 tablet by mouth daily at 12 noon. - Prenatal 28-0.8 MG TABS; Take 1 tablet by mouth daily.  Dispense: 30 tablet; Refill: 12  2. Bipolar disorder, in partial remission, most recent episode manic (HCC) - stable on Xyprexa  3. Cannabis use disorder 4. Alcohol abuse  5. Severe manic bipolar 1 disorder with psychotic behavior (HCC) - stable on Xyprexa  Preterm labor symptoms and general obstetric precautions including but not limited to vaginal bleeding, contractions, leaking of fluid and fetal movement were reviewed in detail with the patient. Please refer to After Visit Summary for other counseling recommendations.   No follow-ups on file.  Future Appointments  Date Time Provider Department Center  09/04/2022  2:30 PM Shanna Cisco, NP [facility] None    [employer], Student-PA Attestation of Attending Supervision of PA Student: Evaluation and management procedures were performed by the PA student under my supervision and collaboration.  I have reviewed the student's note and chart, and I agree with the management and plan.  Scheryl Darter, MD, FACOG Attending Obstetrician & Gynecologist Faculty Practice, [facility]

## 2022-06-26 ENCOUNTER — Encounter: Payer: Self-pay | Admitting: Obstetrics & Gynecology

## 2022-06-26 LAB — CYTOLOGY - PAP
Chlamydia: NEGATIVE
Comment: NEGATIVE
Comment: NORMAL
Diagnosis: NEGATIVE
Neisseria Gonorrhea: NEGATIVE

## 2022-06-26 LAB — AFP, SERUM, OPEN SPINA BIFIDA
AFP MoM: 1.21
AFP Value: 45 ng/mL
Gest. Age on Collection Date: 16 weeks
Maternal Age At EDD: 28.8 yr
OSBR Risk 1 IN: 10000
Test Results:: NEGATIVE
Weight: 149 [lb_av]

## 2022-06-29 NOTE — Addendum Note (Signed)
Addended by: Adam Phenix on: 06/29/2022 05:25 PM   Modules accepted: Orders

## 2022-07-09 NOTE — BH Specialist Note (Signed)
Integrated Behavioral Health via Telemedicine Visit  07/10/2022 Susan Davis [id number]  Number of Integrated Behavioral Health Clinician visits: 1- Initial Visit  Session Start time: [phone]   Session End time: 5621  Total time in minutes: 22   Referring Provider: Scheryl Darter, MD Patient/Family location: Home [facility] Provider location: [facility] for [facility]  All persons participating in visit: Patient Susan Davis and [facility] [name]   Types of Service: Individual psychotherapy and Video visit  I connected with Danyell Doris Cheadle and/or Maryfer Lyndel Pleasure  n/a  via  Telephone or Video Enabled Telemedicine Application  (Video is Caregility application) and verified that I am speaking with the correct person using two identifiers. Discussed confidentiality: Yes   I discussed the limitations of telemedicine and the availability of in person appointments.  Discussed there is a possibility of technology failure and discussed alternative modes of communication if that failure occurs.  I discussed that engaging in this telemedicine visit, they consent to the provision of behavioral healthcare and the services will be billed under their insurance.  Patient and/or legal guardian expressed understanding and consented to Telemedicine visit: Yes   Presenting Concerns: Patient and/or family reports the following symptoms/concerns: Stable mood on Zyprexa, eating and sleeping well, good support at maternity home and with friends/family. Pt goals are to find work and continued mood stability during pregnancy and postpartum.  Duration of problem: Ongoing; Severity of problem: mild  Patient and/or Family's Strengths/Protective Factors: Social connections, Concrete supports in place (healthy food, safe environments, etc.), Sense of purpose, and Physical Health (exercise, healthy diet, medication compliance, etc.)  Goals Addressed: Patient  will:  Maintain reduction of  symptoms of: mood instability   Increase knowledge and/or ability of: healthy habits and stress reduction   Demonstrate ability to: Increase adequate support systems for patient/family  Progress towards Goals: Ongoing  Interventions: Interventions utilized:  Psychoeducation and/or Health Education, Link to [employer], and Supportive Reflection Standardized Assessments completed: Not Needed  Patient and/or Family Response: Patient agrees with treatment plan.   Assessment: Patient currently experiencing Bipolar disorder, in partial remission, most recent episode manic (as previously diagnosed by prescriber).   Patient may benefit from psychoeducation and brief therapeutic interventions regarding maintaining reduction symptoms of mood instability and current life stress .  Plan: Follow up with behavioral health clinician on : One month; Call Lachrista Heslin at [phone], as needed. Behavioral recommendations:  -Continue taking Zyprexa and prenatal vitamin daily as prescribed -Continue appointments at [facility] for medication management -Continue prioritizing healthy self care daily (healthy meals, adequate sleep, accepting support from friends/family as needed during this time) -Read through information and resources on After Visit Summary; use as needed Referral(s): Integrated Art gallery manager (In Clinic) and [employer]:  childcare, [employer], pregnancy support  I discussed the assessment and treatment plan with the patient and/or parent/guardian. They were provided an opportunity to ask questions and all were answered. They agreed with the plan and demonstrated an understanding of the instructions.   They were advised to call back or seek an in-person evaluation if the symptoms worsen or if the condition fails to improve as anticipated.  Rae Lips, LCSW      06/24/2022   10:43 AM 06/12/2022    1:24 PM 05/24/2022   10:54  AM 10/09/2021   12:01 PM 07/05/2021    2:29 PM  Depression screen PHQ 2/9  Decreased Interest 0 0 0 2 0  Down, Depressed, Hopeless  1 0 0 0 0  PHQ - 2 Score 1 0 0 2 0  Altered sleeping 0 0 1 2 0  Tired, decreased energy 0 0 0 0 0  Change in appetite 0 0 0 2 1  Feeling bad or failure about yourself  1 1 3 3 1   Trouble concentrating 0 0 0 2 0  Moving slowly or fidgety/restless 0 0 0 0 0  Suicidal thoughts 0 0 0 0 0  PHQ-9 Score 2 1 4 11 2   Difficult doing work/chores  Not difficult at all  Not difficult at all Not difficult at all      06/24/2022   10:44 AM 06/12/2022    1:24 PM 05/24/2022   10:55 AM 10/09/2021   12:00 PM  GAD 7 : Generalized Anxiety Score  Nervous, Anxious, on Edge 0 0 1 2  Control/stop worrying 0 0 0 2  Worry too much - different things 1 0 0 0  Trouble relaxing 0 0 0 0  Restless 0 0 0 0  Easily annoyed or irritable 0 0 1 3  Afraid - awful might happen 1 0 0 2  Total GAD 7 Score 2 0 2 9  Anxiety Difficulty  Not difficult at all  Not difficult at all

## 2022-07-10 ENCOUNTER — Ambulatory Visit (INDEPENDENT_AMBULATORY_CARE_PROVIDER_SITE_OTHER): Payer: Medicaid Other | Admitting: Clinical

## 2022-07-10 DIAGNOSIS — F3173 Bipolar disorder, in partial remission, most recent episode manic: Secondary | ICD-10-CM | POA: Diagnosis not present

## 2022-07-10 NOTE — Patient Instructions (Addendum)
Center for [facility] at [facility] [address] [place], [place] [address] [phone] (main office) [phone] [facility] office)  [email or web address]  Postpartum Support Nurse, mental health.postpartum.net   [employer] www.[email or web address].org  [place] [employer]  (Childcare options, Early childcare development, etc.) DietDisorder.cz  [employer] Child Care Facility Search Engine  https://ncchildcare.http://cook.com/     BRAINSTORMING  Develop a Plan Goals: Provide a way to start conversation about your new life with a baby Assist parents in recognizing and using resources within their reach Help pave the way before birth for an easier period of transition afterwards.  Make a list of the following information to keep in a central location: Full name of Mom and Partner: _____________________________________________ Baby's full name and Date of Birth: ___________________________________________ Home Address: ___________________________________________________________ ________________________________________________________________________ Home Phone: ____________________________________________________________ Parents' cell numbers: _____________________________________________________ ________________________________________________________________________ Name and contact info for OB: ______________________________________________ Name and contact info for Pediatrician:________________________________________ Contact info for Lactation Consultants: ________________________________________  REST and SLEEP *You each need at least 4-5 hours of uninterrupted sleep every day. Write specific names and contact information.* How are you going to rest in the postpartum period? While partner's home? When partner returns to work? When you both return to work? Where will your baby sleep? Who is available to  help during the day? Evening? Night? Who could move in for a period to help support you? What are some ideas to help you get enough sleep? __________________________________________________________________________________________________________________________________________________________________________________________________________________________________________ NUTRITIOUS FOOD AND DRINK *Plan for meals before your baby is born so you can have healthy food to eat during the immediate postpartum period.* Who will look after breakfast? Lunch? Dinner? List names and contact information. Brainstorm quick, healthy ideas for each meal. What can you do before baby is born to prepare meals for the postpartum period? How can others help you with meals? Which grocery stores provide online shopping and delivery? Which restaurants offer take-out or delivery options? ______________________________________________________________________________________________________________________________________________________________________________________________________________________________________________________________________________________________________________________________________________________________________________________________________  CARE FOR MOM *It's important that mom is cared for and pampered in the postpartum period. Remember, the most important ways new mothers need care are: sleep, nutrition, gentle exercise, and time off.* Who can come take care of mom during this period? Make a list of people with their contact information. List some activities that make you feel cared for, rested, and energized? Who can make sure you have opportunities to do these things? Does mom have a space of her very own within your home that's just for her? Make a "[facility]" where she can be comfortable, rest, and renew herself  daily. ______________________________________________________________________________________________________________________________________________________________________________________________________________________________________________________________________________________________________________________________________________________________________________________________________    CARE FOR AND FEEDING BABY *Knowledgeable and encouraging people will offer the best support with regard to feeding your baby.* Educate yourself and choose the best feeding option for your baby. Make a list of people who will guide, support, and be a resource for you as your care for and feed your baby. (Friends that have breastfed or are currently breastfeeding, lactation consultants, breastfeeding support groups, etc.) Consider a postpartum doula. (These websites can give you information: dona.org & https://shea.org/) Seek out local breastfeeding resources like the breastfeeding support group at [employer] or [employer]. ______________________________________________________________________________________________________________________________________________________________________________________________________________________________________________________________________________________________________________________________________________________________________________________________________  Judson Roch AND ERRANDS Who can help with a thorough cleaning before baby is born? Make a list of people who will help with housekeeping and chores, like laundry, light cleaning, dishes, bathrooms, etc. Who can run some errands for you? What can you do to make sure you are stocked with basic supplies before baby is born? Who is going to  do the  shopping? ______________________________________________________________________________________________________________________________________________________________________________________________________________________________________________________________________________________________________________________________________________________________________________________________________     Family Adjustment *Nurture yourselves.it helps parents be more loving and allows for better bonding with their child.* What sorts of things do you and partner enjoy doing together? Which activities help you to connect and strengthen your relationship? Make a list of those things. Make a list of people whom you trust to care for your baby so you can have some time together as a couple. What types of things help partner feel connected to Mom? Make a list. What needs will partner have in order to bond with baby? Other children? Who will care for them when you go into labor and while you are in the hospital? Think about what the needs of your older children might be. Who can help you meet those needs? In what ways are you helping them prepare for bringing baby home? List some specific strategies you have for family adjustment. _______________________________________________________________________________________________________________________________________________________________________________________________________________________________________________________________________________________________________________________________________________  SUPPORT *Someone who can empathize with experiences normalizes your problems and makes them more bearable.* Make a list of other friends, neighbors, and/or co-workers you know with infants (and small children, if applicable) with whom you can connect. Make a list of local or online support groups, mom groups, etc. in which you can be  involved. ______________________________________________________________________________________________________________________________________________________________________________________________________________________________________________________________________________________________________________________________________________________________________________________________________  Childcare Plans Investigate and plan for childcare if mom is returning to work. Talk about mom's concerns about her transition back to work. Talk about partner's concerns regarding this transition.  Mental Health *Your mental health is one of the highest priorities for a pregnant or postpartum mom.* 1 in 5 women experience anxiety and/or depression from the time of conception through the first year after birth. Postpartum Mood Disorders are the #1 complication of pregnancy and childbirth and the suffering experienced by these mothers is not necessary! These illnesses are temporary and respond well to treatment, which often includes self-care, social support, talk therapy, and medication when needed. Women experiencing anxiety and depression often say things like: "I'm supposed to be happy.why do I feel so sad?", "Why can't I snap out of it?", "I'm having thoughts that scare me." There is no need to be embarrassed if you are feeling these symptoms: Overwhelmed, anxious, angry, sad, guilty, irritable, hopeless, exhausted but can't sleep You are NOT alone. You are NOT to blame. With help, you WILL be well. Where can I find help? Medical professionals such as your OB, midwife, gynecologist, family practitioner, primary care provider, pediatrician, or mental health providers; [facility] support groups: Feelings After Birth, Breastfeeding Support Group, Baby and Me Group, and Fit 4 Two exercise classes. You have permission to ask for help. It will confirm your feelings, validate your experiences,  share/learn coping strategies, and gain support and encouragement as you heal. You are important! BRAINSTORM Make a list of local resources, including resources for mom and for partner. Identify support groups. Identify people to call late at night - include names and contact info. Talk with partner about perinatal mood and anxiety disorders. Talk with your OB, midwife, and doula about baby blues and about perinatal mood and anxiety disorders. Talk with your pediatrician about perinatal mood and anxiety disorders.   Support & Sanity Savers   What do you really need?  Basics In preparing for a new baby, many expectant parents spend hours shopping for baby clothes, decorating the nursery, and deciding which car seat to buy. Yet most don't think much about what the reality of parenting a newborn will be like, and what they need to make  it through that. So, here is the advice of experienced parents. We know you'll read this, and think "they're exaggerating, I don't really need that." Just trust [place] on these, OK? Plan for all of this, and if it turns out you don't need it, come back and teach [place] how you did it!  Must-Haves (Once baby's survival needs are met, make sure you attend to your own survival needs!) Sleep An average newborn sleeps 16-18 hours per day, over 6-7 sleep periods, rarely more than three hours at a time. It is normal and healthy for a newborn to wake throughout the night... but really hard on parents!! Naps. Prioritize sleep above any responsibilities like: cleaning house, visiting friends, running errands, etc.  Sleep whenever baby sleeps. If you can't nap, at least have restful times when baby eats. The more rest you get, the more patient you will be, the more emotionally stable, and better at solving problems.  Food You may not have realized it would be difficult to eat when you have a newborn. Yet, when we talk to countless new parents, they say things like "it may be 2:00 pm  when I realize I haven't had breakfast yet." Or "every time we sit down to dinner, baby needs to eat, and my food gets cold, so I don't bother to eat it." Finger food. Before your baby is born, stock up with one months' worth of food that: 1) you can eat with one hand while holding a baby, 2) doesn't need to be prepped, 3) is good hot or cold, 4) doesn't spoil when left out for a few hours, and 5) you like to eat. Think about: nuts, dried fruit, Clif bars, pretzels, jerky, gogurt, baby carrots, apples, bananas, crackers, cheez-n-crackers, string cheese, hot pockets or frozen burritos to microwave, garden burgers and breakfast pastries to put in the toaster, yogurt drinks, etc. Restaurant Menus. Make lists of your favorite restaurants & menu items. When family/friends want to help, you can give specific information without much thought. They can either bring you the food or send gift cards for just the right meals. Freezer Meals.  Take some time to make a few meals to put in the freezer ahead of time.  Easy to freeze meals can be anything such as soup, lasagna, chicken pie, or spaghetti sauce. Set up a Meal Schedule.  Ask friends and family to sign up to bring you meals during the first few weeks of being home. (It can be passed around at baby showers!) You have no idea how helpful this will be until you are in the throes of parenting.  MachineLive.it is a great website to check out. Emotional Support Know who to call when you're stressed out. Parenting a newborn is very challenging work. There are times when it totally overwhelms your normal coping abilities. EVERY NEW PARENT NEEDS TO HAVE A PLAN FOR WHO TO CALL WHEN THEY JUST CAN'T COPE ANY MORE. (And it has to be someone other than the baby's other parent!) Before your baby is born, come up with at least one person you can call for support - write their phone number down and post it on the refrigerator. Anxiety & Sadness. Baby blues are normal after  pregnancy; however, there are more severe types of anxiety & sadness which can occur and should not be ignored.  They are always treatable, but you have to take the first step by reaching out for help. [facility] offers a "Mom Talk" group which meets  every Tuesday from 10 am - 11 am.  This group is for new moms who need support and connection after their babies are born.  Call [phone].  Really, Really Helpful (Plan for them! Make sure these happen often!!) Physical Support with Taking Care of Yourselves Asking friends and family. Before your baby is born, set up a schedule of people who can come and visit and help out (or ask a friend to schedule for you). Any time someone says "let me know what I can do to help," sign them up for a day. When they get there, their job is not to take care of the baby (that's your job and your joy). Their job is to take care of you!  Postpartum doulas. If you don't have anyone you can call on for support, look into postpartum doulas:  professionals at helping parents with caring for baby, caring for themselves, getting breastfeeding started, and helping with household tasks. www.padanc.org is a helpful website for learning about doulas in our area. Peer Support / Parent Groups Why: One of the greatest ideas for new parents is to be around other new parents. Parent groups give you a chance to share and listen to others who are going through the same season of life, get a sense of what is normal infant development by watching several babies learn and grow, share your stories of triumph and struggles with empathetic ears, and forgive your own mistakes when you realize all parents are learning by trial and error. Where to find: There are many places you can meet other new parents throughout our community.  [facility] offers the following classes for new moms and their little ones:  Baby and Me (Birth to Crawling) and Breastfeeding Support Group. Go to  www.conehealthybaby.com or call [phone] for more information. Time for your Relationship It's easy to get so caught up in meeting baby's immediate needs that it's hard to find time to connect with your partner, and meet the needs of your relationship. It's also easy to forget what "quality time with your partner" actually looks like. If you take your baby on a date, you'd be amazed how much of your couple time is spent feeding the baby, diapering the baby, admiring the baby, and talking about the baby. Dating: Try to take time for just the two of you. Babysitter tip: Sometimes when moms are breastfeeding a newborn, they find it hard to figure out how to schedule outings around baby's unpredictable feeding schedules. Have the babysitter come for a three hour period. When she comes over, if baby has just eaten, you can leave right away, and come back in two hours. If baby hasn't fed recently, you start the date at home. Once baby gets hungry and gets a good feeding in, you can head out for the rest of your date time. Date Nights at Home: If you can't get out, at least set aside one evening a week to prioritize your relationship: whenever baby dozes off or doesn't have any immediate needs, spend a little time focusing on each other. Potential conflicts: The main relationship conflicts that come up for new parents are: issues related to sexuality, financial stresses, a feeling of an unfair division of household tasks, and conflicts in parenting styles. The more you can work on these issues before baby arrives, the better!  Fun and Frills (Don't forget these. and don't feel guilty for indulging in them!) Everyone has something in life that is a fun little treat that  they do just for themselves. It may be: reading the morning paper, or going for a daily jog, or having coffee with a friend once a week, or going to a movie on Friday nights, or fine chocolates, or bubble baths, or curling up with a good  book. Unless you do fun things for yourself every now and then, it's hard to have the energy for fun with your baby. Whatever your "special" treats are, make sure you find a way to continue to indulge in them after your baby is born. These special moments can recharge you, and allow you to return to baby with a new joy   PERINATAL MOOD DISORDERS: MATERNAL MENTAL HEALTH FROM CONCEPTION THROUGH THE POSTPARTUM PERIOD   _________________________________________Emergency and Crisis Resources If you are an imminent risk to self or others, are experiencing intense personal distress, and/or have noticed significant changes in activities of daily living, call:  [facility]: [phone]  [address], [place], [place], [address] Mobile Crisis: [phone] National Suicide Hotline: 988 Or visit the following crisis centers: Local Emergency Departments [facility]: [address], [place]  [phone]. Hours: 8:30AM-5PM. Insurance Accepted: Medicaid, Medicare, and Uninsured.  [facility]:  [address] [place]  Mon-Friday 8am-3pm, [phone]                                                                                  ___________ Non-Crisis Resources To identify specific providers that are covered by your insurance, contact your insurance company or local agencies:  [employer]: [phone] [facility]: [phone] [employer]: [phone] Postpartum Support International- Warm-line: 786-885-5666                                                      __Outpatient Therapy and Medication Management   Providers:  [facility]: [phone] Hours: 9AM-5PM  Insurance Accepted: Pilar Jarvis, Chase Caller, [place], Medicare  [facility] Total Access Care [facility]): [phone] Hours: 8AM-5:30PM  nsurance Accepted: All insurances EXCEPT AARP, [place],  [place], and [employer] of the [place]: [phone] Hours: 8AM-8PM Insurance Accepted: Ulla Gallo, Crista Luria, [place], Medicare, [phone] [facility]: [phone] Hours: 8:30AM-7PM Insurance Accepted: Ulla Gallo, Medicaid, Medicare, Tricare, [employer] [facility]:  [phone]   Hours:9AM-5PM Insurance Accepted:  Monia Pouch, Ezequiel Essex, [employer], [place], [employer] Care  [facility]: [phone] Hours: 9AM-5:30PM Insurance Accepted: Pilar Jarvis, Teodoro Spray, and Medicaid, Medicare, [facility]:  [phone] Hours: 9am-5pm Insurance Accepted: BCBS; they do not accept Medicaid/Medicare [facility]: [phone] Hours: 9am-9pm Insurance Accepted: All major insurance including Medicaid and Medicare [facility]: [phone] Hours: 9AM- 5PM Insurance Accepted: All insurances EXCEPT Medicaid and Medicare. [facility]: [phone]   ____________  Parenting Support Groups [facility]: [phone] [facility]:  [phone] Family Support Network: (support for children in the NICU and/or with special needs), [phone]   ___________                                                                 Mental Health Support Groups Mental Health Association: [phone]    _____________                                                                                  Online Resources Postpartum Support International: http://www.postpartum.net/  800-944-4PPD 2Moms Supporting Moms:  www.momssupportingmoms.net    

## 2022-07-21 NOTE — Progress Notes (Unsigned)
   PRENATAL VISIT NOTE  Subjective:  Susan Davis is a 27 y.o. G1P0 at [redacted]w[redacted]d being seen today for ongoing prenatal care.  She is currently monitored for the following issues for this high-risk pregnancy and has MDD (major depressive disorder); Bipolar disorder, in partial remission, most recent episode manic (HCC); Severe manic bipolar 1 disorder with psychotic behavior (HCC); GAD (generalized anxiety disorder); PTSD (post-traumatic stress disorder); Rubella non-immune status, antepartum; and Encounter for supervision of normal first pregnancy in first trimester on their problem list.  Patient reports a nosebleed at night.  Contractions: Not present. Vag. Bleeding: None.  Movement: Present. Denies leaking of fluid.   The following portions of the patient's history were reviewed and updated as appropriate: allergies, current medications, past family history, past medical history, past social history, past surgical history and problem list.   Objective:   Vitals:   07/22/22 0918  BP: 116/74  Pulse: 87  Weight: 156 lb (70.8 kg)    Fetal Status: Fetal Heart Rate (bpm): 145   Movement: Present     General:  Alert, oriented and cooperative. Patient is in no acute distress.  Skin: Skin is warm and dry. No rash noted.   Cardiovascular: Normal heart rate noted  Respiratory: Normal respiratory effort, no problems with respiration noted  Abdomen: Soft, gravid, appropriate for gestational age.  Pain/Pressure: Absent     Pelvic: Cervical exam deferred        Extremities: Normal range of motion.  Edema: None  Mental Status: Normal mood and affect. Normal behavior. Normal judgment and thought content.   Assessment and Plan:  Pregnancy: G1P0 at [redacted]w[redacted]d 1. Encounter for supervision of normal first pregnancy in first trimester Anatomy u/s ordered Saline spray sent in - [place] MFM OB DETAIL +14 WK; Future  2. Bipolar disorder, in partial remission, most recent episode manic (HCC) Followed by  psych. Doing well on zyprexz - [place] MFM OB DETAIL +14 WK; Future  3. Rubella non-immune status, antepartum Offer mmr postpartum  4. [redacted] weeks gestation of pregnancy - [place] MFM OB DETAIL +14 WK; Future  5. Medication exposure during first trimester of pregnancy Afp neg - [place] MFM OB DETAIL +14 WK; Future  Preterm labor symptoms and general obstetric precautions including but not limited to vaginal bleeding, contractions, leaking of fluid and fetal movement were reviewed in detail with the patient. Please refer to After Visit Summary for other counseling recommendations.   No follow-ups on file.  Future Appointments  Date Time Provider Department Center  08/07/2022  1:15 PM [facility] HEALTH CLINICIAN [facility]  08/14/2022  9:15 AM [facility] NURSE [facility]  08/14/2022  9:30 AM [facility] US2 [facility]  09/04/2022  2:30 PM Shanna Cisco, NP [facility] None    Wall Lane Bing, MD

## 2022-07-22 ENCOUNTER — Ambulatory Visit (INDEPENDENT_AMBULATORY_CARE_PROVIDER_SITE_OTHER): Payer: MEDICAID | Admitting: Obstetrics and Gynecology

## 2022-07-22 ENCOUNTER — Other Ambulatory Visit: Payer: Self-pay

## 2022-07-22 VITALS — BP 116/74 | HR 87 | Wt 156.0 lb

## 2022-07-22 DIAGNOSIS — O09899 Supervision of other high risk pregnancies, unspecified trimester: Secondary | ICD-10-CM

## 2022-07-22 DIAGNOSIS — Z3A20 20 weeks gestation of pregnancy: Secondary | ICD-10-CM

## 2022-07-22 DIAGNOSIS — O09892 Supervision of other high risk pregnancies, second trimester: Secondary | ICD-10-CM

## 2022-07-22 DIAGNOSIS — F1290 Cannabis use, unspecified, uncomplicated: Secondary | ICD-10-CM

## 2022-07-22 DIAGNOSIS — F3173 Bipolar disorder, in partial remission, most recent episode manic: Secondary | ICD-10-CM

## 2022-07-22 DIAGNOSIS — Z2839 Encounter for supervision of normal pregnancy, unspecified, unspecified trimester: Secondary | ICD-10-CM

## 2022-07-22 DIAGNOSIS — Z3401 Encounter for supervision of normal first pregnancy, first trimester: Secondary | ICD-10-CM

## 2022-07-22 DIAGNOSIS — O0972 Supervision of high risk pregnancy due to social problems, second trimester: Secondary | ICD-10-CM

## 2022-07-22 DIAGNOSIS — F1010 Alcohol abuse, uncomplicated: Secondary | ICD-10-CM

## 2022-07-22 DIAGNOSIS — O09891 Supervision of other high risk pregnancies, first trimester: Secondary | ICD-10-CM

## 2022-07-22 MED ORDER — SALINE SPRAY 0.65 % NA SOLN: 1.0000 | Freq: Two times a day (BID) | NASAL | 1 refills | Status: DC | PRN

## 2022-07-22 NOTE — Patient Instructions (Signed)
[place] [facility] Providers  [place] ([address]) [facility] Brown, MD; Chambliss, MD; Eniola, MD; Hensel, MD; McDiarmid, MD; McIntyer, MD [address] [place], [place] [address] [phone] Mon-Fri 8:30-12:30, 1:30-5:00  Providers come to see babies during newborn hospitalization Only accepting infants of Mother's who are seen at Family Medicine Center or have siblings seen at   [facility] Medicaid - Yes; Tricare - Yes   [facility] Mulberry, MD [address] [place], [place] [address] [phone] Mon, Tue, Thur, Fri 8:30-5:00, Wed 10:00-7:00 (closed 1-2pm daily for lunch) Takes [place] residents with no insurance.  [place] [facility] only with Medicaid/insurance; Tricare - no  [facility] Ben-Davies, MD; Brown, MD; Chandler, MD; Ettefagh, MD; Grant, MD; Hanvey, MD; Herrin, MD; Jones,  MD; Lester, MD; McCormick, MD; McQueen, MD; Simha, MD; Stanley, MD; Stryffeler, NP [address], [place], [place] [address] [phone] Mon, Tue, Thur, Fri 8:30-5:30, Wed 9:30-5:30, Sat 8:30-12:30 Only accepting infants of first-time parents or siblings of current patients Hospital discharge coordinator will make follow-up appointment Medicaid - yes; Tricare - yes  [place] ([address]) [facility] Cox, MD; Davis, MD; Dovico, MD; Ettefaugh, MD; Lowe, MD; Nation, MD; Slimp, MD; Sumner, MD; Williams, MD [address], [place], [place] [address] [phone] Mon-Fri 8:30-5:00, closed for lunch 12:30-1:30; Sat-Sun 10:00-1:00 Accepting Newborns with commercial insurance only, must call prior to delivery to be accepted into  practice.  Medicaid - no, Tricare - yes   [facility] [address] [place], [place] [address] [phone] or [phone] Mon to Fri 8am to 10pm, Sat 8am to 1pm  (virtual only on weekends) Only accepts Medicaid Healthy Blue pts  [facility]  Artis, MD; Coccaro, MD; Lockett Gardner, MD; Netherton, NP; Roper, MD; Wilmot, PA-C; Skinner, MD [address] [place], [place] [address] [phone] Mon-Fri 8:30-5:30 Medicaid - yes, Tricare - yes  [place] ([address]) [facility] Warner, MD [address], [place], [place] [address] [phone] Mon, Tues, Wed Fri 8:30-5:00, Sat 8:30-12:00, Closed Thursdays Accepting siblings of established patients and first time mom's if you call prenatally Medicaid- yes; Tricare - yes  [facility] Becker, PA; Hagler, MD; Quinn, PA-C; Scifres, PA; Sun, MD; Swayne, MD;  [address], [place], [place] [address] [phone] Mon-Fri 8:30-5:00, closed for lunch 1-2 Only accepting newborns of established patients Medicaid- no; Tricare - yes  [place] ([address]) [facility] Timberlake, MD; [address], [place], [place] [address] [phone] Mon-Fri 8:00-5:00 Medicaid - No; Tricare - Yes  [facility] [employer]  Brake, NP; Wharton, PA [address], [place], [place] [address] [phone] Mon-Fri 8:00-5:00 Medicaid - No, Tricare - Yes  [facility] Gay, MD; Quinlan, MD; Blatt, DNP [address] [place], [place] [address] [phone]  Mon-Fri 8:00-5:00 Medicaid - No; Tricare - Yes  [facility] [address] [place], [place] [address] [phone] Mon-Fri 8:30-5:00 (lunch 12:00-1:00) Medicaid -Yes; Tricare - Yes  [facility] Jordan, MD [address], [place], [place] [address] [phone] Mon-Fri 8:00-5:00 Seeing newborns of current patients only. No new patients Medicaid - No, Tricare - yes  [facility] Parker, MD [address] , [place] [address] [phone] Mon-Fri 8:00-5:00 Medicaid -yes as secondary coverage only;   Tricare - yes  [facility] Brecken, PA; Christy, NP; Dees, MD; DeClaire, MD; DeWeese, MD; Hodge, PA; Smoot, NP; Summer, MD; Vapne, MD [address] [place], [place] [address] [phone] Mon-Fri 8:30-5:00, Sat 9:00-11:00 Accepts commercial insurance ONLY. Offers free prenatal information sessions for families. Medicaid - No, Tricare - Call first  [facility] Bouska, MD; Gordon, PA; Jeffery, PA; Weber, PA [address] [place] [address] [phone] Mon-Fri 7:30-5:30 Medicaid - Yes; Tricare - yes  [place] ([address] & [address])  [facility] Reese, MD [address] [place], [place] [address] [phone] Mon-Thur 8:00-6:00, closed for lunch 12-2, closed Fridays Medicaid - yes; Tricare - no  [facility] Anderson, NP; Badger, MD; Beal, PA; Spencer, PA [address], [place], [place] [address] [phone] Mon-Fri 7:30-4:30 Medicaid - yes, Tricare - yes  [facility]  Agbuya, MD; Klett, NP; Romgoolam, MD; Rothstein, NP [address], [place], [place] [address] [phone] Mon-Fri 8:30-5:00, closed for lunch 1-2, Sat 8:30-12:00 - sick visits only Providers come to see babies at [facility] Only accepting newborns of siblings and first time parents ONLY if who have met with office prior to delivery Medicaid -Yes; Tricare - yes  [facility]  Golden, DO; Friddle, NP; Wallace, MD; Wood, MD:  [address], [place], [place] [address] [phone] Mon- Fri 8:00-5:00, Sat 9:00-12:00 - sick visits only Accepting siblings of established patients and first time mom/baby Medicaid - Yes; Tricare - yes Patients must have vaccinations (baby vaccines)  [place] ([address] &  [address])  [facility] [address] [place], [place] [address] [phone] Mon-Fri 8:00-5:00 Medicaid - no; Tricare - yes  [facility] Briscoe, MD; Schmidt, PA; Moreira, PA [address], [place], [place] [address] [phone] Mon-Fri 8:00-5:00 Medicaid- yes; Tricare - yes  [facility] Boyd, MD; Jones, NP; Osborn, PA [address], ,  [address] [phone] Mon-Fri 8:00-5:00 Medicaid - Yes; Tricare - yes  

## 2022-07-24 NOTE — BH Specialist Note (Signed)
Integrated Behavioral Health via Telemedicine Visit  07/24/2022 Lloyd Kaylanni Ezelle [id number]  Number of Integrated Behavioral Health Clinician visits: 1- Initial Visit  Session Start time: [phone]   Session End time: 0838  Total time in minutes: 22   Referring Provider: *** Patient/Family location: [facility] Provider location: *** All persons participating in visit: *** Types of Service: {CHL AMB TYPE OF SERVICE:[id number]}  I connected with Emmerie Doris Cheadle and/or Jayma Lyndel Pleasure {family members:20773} via  Telephone or Video Enabled Telemedicine Application  (Video is Caregility application) and verified that I am speaking with the correct person using two identifiers. Discussed confidentiality: {YES/NO:21197}  I discussed the limitations of telemedicine and the availability of in person appointments.  Discussed there is a possibility of technology failure and discussed alternative modes of communication if that failure occurs.  I discussed that engaging in this telemedicine visit, they consent to the provision of behavioral healthcare and the services will be billed under their insurance.  Patient and/or legal guardian expressed understanding and consented to Telemedicine visit: {YES/NO:21197}  Presenting Concerns: Patient and/or family reports the following symptoms/concerns: *** Duration of problem: ***; Severity of problem: {Mild/Moderate/Severe:20260}  Patient and/or Family's Strengths/Protective Factors: {CHL AMB BH PROTECTIVE FACTORS:[phone]}  Goals Addressed: Patient will:  Reduce symptoms of: {IBH Symptoms:21014056}   Increase knowledge and/or ability of: {IBH Patient Tools:21014057}   Demonstrate ability to: {IBH Goals:21014053}  Progress towards Goals: {CHL AMB BH PROGRESS TOWARDS GOALS:[phone]}  Interventions: Interventions utilized:  {IBH Interventions:21014054} Standardized Assessments completed: {IBH Screening Tools:21014051}  Patient  and/or Family Response: ***  Assessment: Patient currently experiencing ***.   Patient may benefit from ***.  Plan: Follow up with behavioral health clinician on : *** Behavioral recommendations: *** Referral(s): {IBH Referrals:21014055}  I discussed the assessment and treatment plan with the patient and/or parent/guardian. They were provided an opportunity to ask questions and all were answered. They agreed with the plan and demonstrated an understanding of the instructions.   They were advised to call back or seek an in-person evaluation if the symptoms worsen or if the condition fails to improve as anticipated.  Valetta Close Hezakiah Champeau, LCSW

## 2022-07-25 ENCOUNTER — Encounter (HOSPITAL_COMMUNITY): Payer: Self-pay | Admitting: Psychiatry

## 2022-07-25 ENCOUNTER — Telehealth (HOSPITAL_COMMUNITY): Payer: Self-pay | Admitting: *Deleted

## 2022-07-25 NOTE — Telephone Encounter (Signed)
Patient called asking for a return call about her prescription. She needs a letter or proof that she is getting her medication from this facility. Her return call number [phone]

## 2022-07-25 NOTE — Telephone Encounter (Signed)
Patient informed Clinical research associate that she is currently staying at [facility].  She notes that she needs proof of her mental health treatment as well as medication.  Provider wrote a noted detailing this information.

## 2022-07-30 ENCOUNTER — Telehealth (HOSPITAL_COMMUNITY): Payer: Self-pay | Admitting: *Deleted

## 2022-07-30 ENCOUNTER — Other Ambulatory Visit (HOSPITAL_COMMUNITY): Payer: Self-pay | Admitting: Psychiatry

## 2022-07-30 DIAGNOSIS — F3173 Bipolar disorder, in partial remission, most recent episode manic: Secondary | ICD-10-CM

## 2022-07-30 MED ORDER — OLANZAPINE 20 MG PO TABS: 20.0000 mg | ORAL_TABLET | Freq: Every day | ORAL | 3 refills | Status: DC

## 2022-07-30 NOTE — Telephone Encounter (Signed)
Medication refilled and sent to preferred pharmacy

## 2022-07-30 NOTE — Telephone Encounter (Signed)
Susan Davis with [facility] called to ask that patients prescriptions be sent to [facility] in [place] at [address] [place], [address]. Patient number [phone] and she is staying at a place for pregnant women who was recently incarcerated. She can pick them up while staying at the [facility].

## 2022-08-07 ENCOUNTER — Ambulatory Visit: Payer: Medicaid Other | Admitting: Clinical

## 2022-08-07 DIAGNOSIS — Z91199 Patient's noncompliance with other medical treatment and regimen due to unspecified reason: Secondary | ICD-10-CM

## 2022-08-09 DIAGNOSIS — O09891 Supervision of other high risk pregnancies, first trimester: Secondary | ICD-10-CM | POA: Insufficient documentation

## 2022-08-14 ENCOUNTER — Encounter: Payer: Self-pay | Admitting: *Deleted

## 2022-08-14 ENCOUNTER — Ambulatory Visit: Payer: MEDICAID | Attending: Obstetrics and Gynecology

## 2022-08-14 ENCOUNTER — Other Ambulatory Visit: Payer: Self-pay | Admitting: *Deleted

## 2022-08-14 ENCOUNTER — Ambulatory Visit (HOSPITAL_BASED_OUTPATIENT_CLINIC_OR_DEPARTMENT_OTHER): Payer: MEDICAID | Admitting: Obstetrics

## 2022-08-14 ENCOUNTER — Ambulatory Visit: Payer: MEDICAID | Admitting: *Deleted

## 2022-08-14 VITALS — BP 105/59 | HR 78

## 2022-08-14 DIAGNOSIS — O09891 Supervision of other high risk pregnancies, first trimester: Secondary | ICD-10-CM

## 2022-08-14 DIAGNOSIS — O09892 Supervision of other high risk pregnancies, second trimester: Secondary | ICD-10-CM | POA: Insufficient documentation

## 2022-08-14 DIAGNOSIS — O99342 Other mental disorders complicating pregnancy, second trimester: Secondary | ICD-10-CM | POA: Diagnosis not present

## 2022-08-14 DIAGNOSIS — Z3A23 23 weeks gestation of pregnancy: Secondary | ICD-10-CM | POA: Insufficient documentation

## 2022-08-14 DIAGNOSIS — O358XX0 Maternal care for other (suspected) fetal abnormality and damage, not applicable or unspecified: Secondary | ICD-10-CM

## 2022-08-14 DIAGNOSIS — F3173 Bipolar disorder, in partial remission, most recent episode manic: Secondary | ICD-10-CM | POA: Diagnosis not present

## 2022-08-14 DIAGNOSIS — Z3A20 20 weeks gestation of pregnancy: Secondary | ICD-10-CM | POA: Insufficient documentation

## 2022-08-14 DIAGNOSIS — O35BXX0 Maternal care for other (suspected) fetal abnormality and damage, fetal cardiac anomalies, not applicable or unspecified: Secondary | ICD-10-CM

## 2022-08-14 DIAGNOSIS — F419 Anxiety disorder, unspecified: Secondary | ICD-10-CM | POA: Diagnosis not present

## 2022-08-14 DIAGNOSIS — Z3401 Encounter for supervision of normal first pregnancy, first trimester: Secondary | ICD-10-CM | POA: Insufficient documentation

## 2022-08-14 DIAGNOSIS — O99212 Obesity complicating pregnancy, second trimester: Secondary | ICD-10-CM | POA: Insufficient documentation

## 2022-08-14 NOTE — Progress Notes (Signed)
MFM Note  Susan Davis was seen for a detailed fetal anatomy scan due to maternal medication exposure.  She is currently treated with Zyprexa for bipolar disorder.  She denies any problems in her current pregnancy.    She had a cell free DNA test earlier in her pregnancy which indicated a low risk for trisomy 67, 81, and 13. A female fetus is predicted.   She was informed that the fetal growth and amniotic fluid level were appropriate for her gestational age.   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  On the three-vessel trachea view, it was noted that the right aortic arch and the left sided ductus arteriosus forms a "U" configuration around the trachea.  The patient and her partner were advised that the loose vascular ring formed by the right-sided aortic arch and the left-sided ductus arteriosus may cause compression of the trachea after birth.    Should her baby experience coughing or trouble breathing after birth due to compression of the trachea, her baby may need surgery to remove the ligamentum arteriosum which is formed by the closed ductus arteriosus.  They were reassured that most babies with a right aortic arch tend to be asymptomatic after birth.  Due to the possible right aortic arch and the vascular ring noted today, she was referred to [facility] pediatric cardiology for a fetal echocardiogram.  The patient was informed that anomalies may be missed due to technical limitations. If the fetus is in a suboptimal position or maternal habitus is increased, visualization of the fetus in the maternal uterus may be impaired.  As any fetus with a cardiac anomaly may be at increased risk for a chromosomal abnormality, the patient was offered and declined an amniocentesis today for definitive diagnosis.  She is comfortable with her negative cell free DNA test.    Due to the right-sided aortic arch and the  vascular ring, we will continue to follow her with growth ultrasounds throughout her pregnancy.  We will present her case at a future [facility] meeting.  A follow-up exam was scheduled in 4 weeks.    The patient and her partner stated that all of their questions were answered today.  A total of 45 minutes was spent counseling and coordinating the care for this patient.  Greater than 50% of the time was spent in direct face-to-face contact.

## 2022-08-16 ENCOUNTER — Encounter: Payer: Self-pay | Admitting: Obstetrics and Gynecology

## 2022-08-16 DIAGNOSIS — O283 Abnormal ultrasonic finding on antenatal screening of mother: Secondary | ICD-10-CM | POA: Insufficient documentation

## 2022-08-20 ENCOUNTER — Encounter (HOSPITAL_COMMUNITY): Payer: Self-pay

## 2022-09-04 ENCOUNTER — Telehealth (HOSPITAL_COMMUNITY): Payer: Self-pay | Admitting: Psychiatry

## 2022-09-05 ENCOUNTER — Telehealth (HOSPITAL_COMMUNITY): Payer: MEDICAID | Admitting: Psychiatry

## 2022-09-05 ENCOUNTER — Encounter (HOSPITAL_COMMUNITY): Payer: Self-pay | Admitting: Psychiatry

## 2022-09-05 DIAGNOSIS — F3173 Bipolar disorder, in partial remission, most recent episode manic: Secondary | ICD-10-CM

## 2022-09-05 MED ORDER — OLANZAPINE 20 MG PO TABS: 20.0000 mg | ORAL_TABLET | Freq: Every day | ORAL | 3 refills | Status: DC

## 2022-09-05 NOTE — Progress Notes (Signed)
BH MD/PA/NP OP Progress Note. Virtual Visit via Video Note  I connected with Susan Davis on 09/05/22 at  1:30 PM EDT by a video enabled telemedicine application and verified that I am speaking with the correct person using two identifiers.  Location: Patient: Car Provider: Clinic   I discussed the limitations of evaluation and management by telemedicine and the availability of in person appointments. The patient expressed understanding and agreed to proceed.  I provided 30 minutes of non-face-to-face time during this encounter.          09/05/2022 2:38 PM Susan Davis  MRN:  [id number]  Chief Complaint: "I feel low at times"   HPI: 27 year old female seen today for follow up psychiatric evaluation.   She has a psychiatric history of bipolar disorder, anxiety, and depression.  Patients currently managed on 20 mg nightly. She notes that her medication is effective in managing her psychiatric conditions.   Today she was well groomed,  pleasant, cooperative, and engaged in conversation.  She informed Clinical research associate that at times she feels low.  She informed Clinical research associate that this depresses state is situational.  She informed Clinical research associate that she is currently 6 months pregnant.  She notes that she worries about her health, her baby, and her life.  Currently she is not with her child's father romantically but notes that they are coparenting effectively.  Patient informed writer that at times she has bodily pains from her growing pelvis.  She quantifies her pain as 8 out of 10.  At this time she notes that she is not taking medication to help manage the pain.  Provider recommended yoga, swimming, light exercisem and use of heat/ice sparingly.  She endorsed understanding and agreed.    Patient informed writer that she would like to discussed risk and benefits of being on Zyprexa while pregnant.  Provider discussed these risk.  Provider informed patient that newborns of women who take  antipsychoticsmay experience withdrawals and present with symptoms including agitation, decreased muscle tone, tremors, difficulty sleeping, and poor feeding.  Provider asked how her baby was developing.  She informed Clinical research associate that her OB/GYN reports that her child is developing well.  No abnormal findings noted at this time.  Prior to starting Zyprexa patient was hospitalized due to aggressive behavior and threatening to kill family with a knife.  At this time patient denies aggressive behaviors or symptoms of mania.  She does note at times she becomes irritable but is able to cope with his irritability.  She notes that after the birth of her child she may want to increase her Zyprexa.   Since her last visit she informed writer that her anxiety and depression continue to be minimal.  Provider conducted a GAD-7 and patient scored a 4, her last visit she scored a 0.  Provider also conducted PHQ-9 and patient scored a 4, at her last visit she scored a 1.  She endorses adequate sleep and appetite.  Today she denies SI/HI/VAH or mania.  Patient informed Clinical research associate that recently she started new position at [employer].  She notes that she dislikes her job but is grateful that she has got one.  No medication changes made today.  Patient agreeable to continue medications as prescribed.  Provider asked patient if she was interested in therapy and she notes that she was not at this time.  No other concerns noted at this time.   Visit Diagnosis:    ICD-10-CM   1. Bipolar disorder, in partial remission, most  recent episode manic (HCC)  F31.73 OLANZapine (ZYPREXA) 20 MG tablet        Past Psychiatric History: Depression, Anxiety, and Bipolar affective Past Medical History:  Past Medical History:  Diagnosis Date   Anxiety    Bipolar 1 disorder (HCC)    Depression    Medical history non-contributory     Past Surgical History:  Procedure Laterality Date   NO PAST SURGERIES      Family Psychiatric History:  Patient notes that several people in her family suffer from anxiety and depression however she could not give provider specifics of who they were.    Family History:  Family History  Problem Relation Age of Onset   Hypertension Mother    Diabetes Mother     Social History:  Social History   Socioeconomic History   Marital status: Single    Spouse name: Not on file   Number of children: Not on file   Years of education: Not on file   Highest education level: Not on file  Occupational History   Not on file  Tobacco Use   Smoking status: Never   Smokeless tobacco: Never  Vaping Use   Vaping status: Never Used  Substance and Sexual Activity   Alcohol use: Not Currently    Comment: "occasionally", not since confirmed pregnancy   Drug use: Yes    Types: Marijuana    Comment: pt states its been 1-2 months, not since 3/31   Sexual activity: Yes    Partners: Male    Birth control/protection: None  Other Topics Concern   Not on file  Social History Narrative   Not on file   Social Determinants of Health   Financial Resource Strain: Not on file  Food Insecurity: No Food Insecurity (04/03/2022)   Hunger Vital Sign    Worried About Running Out of Food in the Last Year: Never true    Ran Out of Food in the Last Year: Never true  Transportation Needs: No Transportation Needs (04/03/2022)   PRAPARE - Administrator, Civil Service (Medical): No    Lack of Transportation (Non-Medical): No  Physical Activity: Not on file  Stress: Not on file  Social Connections: Not on file    Allergies:  Allergies  Allergen Reactions   Other Other (See Comments)    Allergic to Unknown Anxiety Medication which causes twitching (2021)  Regarding entry above- is this "Hydroxyzine"?? (2024)   Hydroxyzine Pamoate Swelling, Anxiety and Other (See Comments)    Shaking made feel "like having a panic attack"    Metabolic Disorder Labs: Lab Results  Component Value Date   HGBA1C 5.5  04/04/2022   MPG 111 04/04/2022   Lab Results  Component Value Date   PROLACTIN 23.0 09/24/2021   PROLACTIN 52.1 (H) 08/14/2020   Lab Results  Component Value Date   CHOL 125 04/04/2022   TRIG 72 04/04/2022   HDL 58 04/04/2022   CHOLHDL 2.2 04/04/2022   VLDL 14 04/04/2022   LDLCALC 53 04/04/2022   LDLCALC 81 05/19/2018   Lab Results  Component Value Date   TSH 1.010 04/04/2022   TSH 1.430 09/24/2021    Therapeutic Level Labs: No results found for: "LITHIUM" Lab Results  Component Value Date   VALPROATE <10 (L) 04/05/2022   VALPROATE 29 (L) 09/24/2021   No results found for: "CBMZ"  Current Medications: Current Outpatient Medications  Medication Sig Dispense Refill   OLANZapine (ZYPREXA) 20 MG tablet Take 1 tablet (  20 mg total) by mouth at bedtime. 30 tablet 3   Prenatal 28-0.8 MG TABS Take 1 tablet by mouth daily. 30 tablet 12   sodium chloride (OCEAN) 0.65 % SOLN nasal spray Place 1 spray into both nostrils 2 (two) times daily as needed for congestion (and dry nose). 30 mL 1   No current facility-administered medications for this visit.     Musculoskeletal: Strength & Muscle Tone: within normal limits and Telehealth visit Gait & Station: normal, Telehealth visit Patient leans: N/A  Psychiatric Specialty Exam: Review of Systems  Last menstrual period 03/01/2022.There is no height or weight on file to calculate BMI.  General Appearance: Well Groomed  Eye Contact:  Good  Speech:  Clear and Coherent and Normal Rate  Volume:  Normal  Mood:  Anxious and Depressed,   Affect:  Appropriate and Congruent  Thought Process:  Coherent, Goal Directed and Linear  Orientation:  Full (Time, Place, and Person)  Thought Content: WDL and Logical   Suicidal Thoughts:  No  Homicidal Thoughts:  No  Memory:  Immediate;   Good Recent;   Good Remote;   Good  Judgement:  Good  Insight:  Good  Psychomotor Activity:  Normal  Concentration:  Concentration: Good and Attention  Span: Good  Recall:  Good  Fund of Knowledge: Good  Language: Good  Akathisia:  No  Handed:  Right  AIMS (if indicated): Not done  Assets:  Communication Skills Desire for Improvement Financial Resources/Insurance Housing Social Support  ADL's:  Intact  Cognition: WNL  Sleep:  Good   Screenings: AIMS    Flowsheet Row [facility]) [facility]  AIMS Total Score 0 0      AUDIT    Flowsheet Row [facility]) [facility] Ad[facility] (Dis[facility]  Alcohol Use Disorder Identification Test Final Score (AUDIT) 6 0      GAD-7    Flowsheet Row Video Visit from 09/05/2022 in [facility] Initial Prenatal from 06/24/2022 in [facility] Video Visit from 06/12/2022 in [facility] Video Visit from 10[facility]09/2021 in [facility] Video Visit from 07/05/2021 in [facility]  Total GAD-7 Score 4 2 0 9 4      PHQ2-9    Flowsheet Row Video Visit from 09/05/2022 in [facility] Initial Prenatal from 06/24/2022 in [facility] Video Visit from 06/12/2022 in [facility] Video Visit from 10/09/2021 in [facility] Video Visit from 07/05/2021 in [facility]  PHQ-2 Total Score 2 1 0 2 0  PHQ-9 Total Score 4 2 1 11 2       Flowsheet Row Ad[facility]) [facility] from 04[facility]01/2022 [facility] Video Visit from  05/01/2020 in [facility]  C-SSRS RISK CATEGORY No Risk No Risk No Risk        Assessment and Plan: Patient reports she has been more depressed due to situational stressors.  She however reports that she is able to cope with it.  She also describes having increased irritability but  notes that she too is able to cope with this.  After the birth of her child she notes that she may want to increase Zyprexa.  Provider discussed risk and benefits of patient being on Zyprexa while pregnant.  She endorsed understanding.  No medication changes made today.  Patient agreeable to continue medication as prescribed.  1. Bipolar disorder, in partial remission, most recent episode manic (HCC)  Continue- OLANZapine (ZYPREXA) 20 MG tablet; Take 1 tablet (20 mg total) by mouth at bedtime.  Dispense: 30 tablet; Refill: 3  Follow up in 3 months  Shanna Cisco, NP 09/05/2022, 2:38 PM

## 2022-09-12 ENCOUNTER — Other Ambulatory Visit: Payer: Self-pay | Admitting: *Deleted

## 2022-09-12 ENCOUNTER — Ambulatory Visit: Payer: MEDICAID | Attending: Obstetrics

## 2022-09-12 ENCOUNTER — Telehealth: Payer: Self-pay | Admitting: Family Medicine

## 2022-09-12 ENCOUNTER — Ambulatory Visit: Payer: MEDICAID

## 2022-09-12 DIAGNOSIS — O09891 Supervision of other high risk pregnancies, first trimester: Secondary | ICD-10-CM | POA: Insufficient documentation

## 2022-09-12 DIAGNOSIS — O99342 Other mental disorders complicating pregnancy, second trimester: Secondary | ICD-10-CM | POA: Diagnosis not present

## 2022-09-12 DIAGNOSIS — O283 Abnormal ultrasonic finding on antenatal screening of mother: Secondary | ICD-10-CM

## 2022-09-12 DIAGNOSIS — Z3A27 27 weeks gestation of pregnancy: Secondary | ICD-10-CM

## 2022-09-12 DIAGNOSIS — O35BXX0 Maternal care for other (suspected) fetal abnormality and damage, fetal cardiac anomalies, not applicable or unspecified: Secondary | ICD-10-CM | POA: Diagnosis not present

## 2022-09-12 DIAGNOSIS — F419 Anxiety disorder, unspecified: Secondary | ICD-10-CM

## 2022-09-12 NOTE — Telephone Encounter (Signed)
Patient want to discuss getting taking out of work, said she is under a lot of stress

## 2022-09-17 ENCOUNTER — Ambulatory Visit (INDEPENDENT_AMBULATORY_CARE_PROVIDER_SITE_OTHER): Payer: MEDICAID | Admitting: Advanced Practice Midwife

## 2022-09-17 ENCOUNTER — Other Ambulatory Visit: Payer: Self-pay

## 2022-09-17 VITALS — BP 120/77 | HR 97 | Wt 162.0 lb

## 2022-09-17 DIAGNOSIS — O0973 Supervision of high risk pregnancy due to social problems, third trimester: Secondary | ICD-10-CM

## 2022-09-17 DIAGNOSIS — Z2839 Other underimmunization status: Secondary | ICD-10-CM

## 2022-09-17 DIAGNOSIS — Z3A28 28 weeks gestation of pregnancy: Secondary | ICD-10-CM

## 2022-09-17 DIAGNOSIS — F3173 Bipolar disorder, in partial remission, most recent episode manic: Secondary | ICD-10-CM

## 2022-09-17 DIAGNOSIS — O0972 Supervision of high risk pregnancy due to social problems, second trimester: Secondary | ICD-10-CM

## 2022-09-17 DIAGNOSIS — O283 Abnormal ultrasonic finding on antenatal screening of mother: Secondary | ICD-10-CM

## 2022-09-17 DIAGNOSIS — F411 Generalized anxiety disorder: Secondary | ICD-10-CM

## 2022-09-17 DIAGNOSIS — O09893 Supervision of other high risk pregnancies, third trimester: Secondary | ICD-10-CM

## 2022-09-17 NOTE — Patient Instructions (Addendum)
[email or web address]     TDaP Vaccine Pregnancy Get the Whooping Cough Vaccine While You Are Pregnant (CDC)  It is important for women to get the whooping cough vaccine in the third trimester of each pregnancy. Vaccines are the best way to prevent this disease. There are 2 different whooping cough vaccines. Both vaccines combine protection against whooping cough, tetanus and diphtheria, but they are for different age groups: Tdap: for everyone 11 years or older, including pregnant women  DTaP: for children 2 months through 6 years of age  You need the whooping cough vaccine during each of your pregnancies The recommended time to get the shot is during your 27th through 36th week of pregnancy, preferably during the earlier part of this time period. The Centers for Disease Control and Prevention (CDC) recommends that pregnant women receive the whooping cough vaccine for adolescents and adults (called Tdap vaccine) during the third trimester of each pregnancy. The recommended time to get the shot is during your 27th through 36th week of pregnancy, preferably during the earlier part of this time period. This replaces the original recommendation that pregnant women get the vaccine only if they had not previously received it. The American College of Obstetricians and Gynecologists and the American College of Nurse-Midwives support this recommendation.  You should get the whooping cough vaccine while pregnant to pass protection to your baby frame support disabled and/or not supported in this browser  Learn why Laura decided to get the whooping cough vaccine in her 3rd trimester of pregnancy and how her baby girl was born with some protection against the disease. Also available on YouTube. After receiving the whooping cough vaccine, your body will create protective antibodies (proteins produced by the body to fight off diseases) and pass some of them to your baby before birth. These antibodies  provide your baby some short-term protection against whooping cough in early life. These antibodies can also protect your baby from some of the more serious complications that come along with whooping cough. Your protective antibodies are at their highest about 2 weeks after getting the vaccine, but it takes time to pass them to your baby. So the preferred time to get the whooping cough vaccine is early in your third trimester. The amount of whooping cough antibodies in your body decreases over time. That is why CDC recommends you get a whooping cough vaccine during each pregnancy. Doing so allows each of your babies to get the greatest number of protective antibodies from you. This means each of your babies will get the best protection possible against this disease.  Getting the whooping cough vaccine while pregnant is better than getting the vaccine after you give birth Whooping cough vaccination during pregnancy is ideal so your baby will have short-term protection as soon as he is born. This early protection is important because your baby will not start getting his whooping cough vaccines until he is 2 months old. These first few months of life are when your baby is at greatest risk for catching whooping cough. This is also when he's at greatest risk for having severe, potentially life-threating complications from the infection. To avoid that gap in protection, it is best to get a whooping cough vaccine during pregnancy. You will then pass protection to your baby before he is born. To continue protecting your baby, he should get whooping cough vaccines starting at 2 months old. You may never have gotten the Tdap vaccine before and did not get it during this pregnancy. If   so, you should make sure to get the vaccine immediately after you give birth, before leaving the hospital or birthing center. It will take about 2 weeks before your body develops protection (antibodies) in response to the vaccine. Once you  have protection from the vaccine, you are less likely to give whooping cough to your newborn while caring for him. But remember, your baby will still be at risk for catching whooping cough from others. A recent study looked to see how effective Tdap was at preventing whooping cough in babies whose mothers got the vaccine while pregnant or in the hospital after giving birth. The study found that getting Tdap between 27 through 36 weeks of pregnancy is 85% more effective at preventing whooping cough in babies younger than 2 months old. Blood tests cannot tell if you need a whooping cough vaccine There are no blood tests that can tell you if you have enough antibodies in your body to protect yourself or your baby against whooping cough. Even if you have been sick with whooping cough in the past or previously received the vaccine, you still should get the vaccine during each pregnancy. Breastfeeding may pass some protective antibodies onto your baby By breastfeeding, you may pass some antibodies you have made in response to the vaccine to your baby. When you get a whooping cough vaccine during your pregnancy, you will have antibodies in your breast milk that you can share with your baby as soon as your milk comes in. However, your baby will not get protective antibodies immediately if you wait to get the whooping cough vaccine until after delivering your baby. This is because it takes about 2 weeks for your body to create antibodies. Learn more about the health benefits of breastfeeding.  

## 2022-09-17 NOTE — Progress Notes (Signed)
   PRENATAL VISIT NOTE  Subjective:  Susan Davis is a 27 y.o. G1P0 at [redacted]w[redacted]d being seen today for ongoing prenatal care.  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  Patient reports  questions about whether she need to come out of work at [employer].  .  Contractions: Not present. Vag. Bleeding: None.  Movement: Present. Denies leaking of fluid.   The following portions of the patient's history were reviewed and updated as appropriate: allergies, current medications, past family history, past medical history, past social history, past surgical history and problem list.   Objective:   Vitals:   09/17/22 1530  BP: 120/77  Pulse: 97  Weight: 162 lb (73.5 kg)    Fetal Status: Fetal Heart Rate (bpm): 161   Movement: Present     General:  Alert, oriented and cooperative. Patient is in no acute distress.  Skin: Skin is warm and dry. No rash noted.   Cardiovascular: Normal heart rate noted  Respiratory: Normal respiratory effort, no problems with respiration noted  Abdomen: Soft, gravid, appropriate for gestational age.  Pain/Pressure: Present     Pelvic: Cervical exam deferred        Extremities: Normal range of motion.  Edema: None  Mental Status: Normal mood and affect. Normal behavior. Normal judgment and thought content.   Assessment and Plan:  Pregnancy: G1P0 at [redacted]w[redacted]d 1. Supervision of high risk pregnancy due to social problems in second  trimester - GTT ASAP - Plans TDaP NV. Info given.   2. Rubella non-immune status, antepartum - MMR PP  3. 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 Bipolar disorder, in partial remission, most recent episode manic (HCC) - Followed by Psych   5. GAD (generalized anxiety disorder) - Followed by Psych  Preterm labor symptoms and general obstetric precautions including but not limited to vaginal bleeding, contractions, leaking of fluid and fetal movement were reviewed in detail with the patient. Please refer to After Visit Summary for other counseling recommendations.   No follow-ups on file.  Future Appointments  Date Time Provider Department Center  10/17/2022  9:30 AM [facility] US2 [facility]  11/20/2022  8:30 AM Shanna Cisco, NP [facility] None    Dorathy Kinsman, [place]

## 2022-09-19 ENCOUNTER — Other Ambulatory Visit: Payer: MEDICAID

## 2022-09-19 ENCOUNTER — Other Ambulatory Visit: Payer: Self-pay | Admitting: General Practice

## 2022-09-19 DIAGNOSIS — O0972 Supervision of high risk pregnancy due to social problems, second trimester: Secondary | ICD-10-CM

## 2022-09-19 NOTE — Telephone Encounter (Signed)
Patient had a OV on 09/17/2022. This was addressed by the provider.   Felecia Shelling CMA

## 2022-09-23 ENCOUNTER — Other Ambulatory Visit: Payer: MEDICAID

## 2022-09-23 ENCOUNTER — Other Ambulatory Visit: Payer: Self-pay

## 2022-09-23 DIAGNOSIS — O0972 Supervision of high risk pregnancy due to social problems, second trimester: Secondary | ICD-10-CM

## 2022-09-24 ENCOUNTER — Encounter: Payer: Self-pay | Admitting: Advanced Practice Midwife

## 2022-09-24 ENCOUNTER — Other Ambulatory Visit: Payer: Self-pay | Admitting: Advanced Practice Midwife

## 2022-09-24 DIAGNOSIS — O24419 Gestational diabetes mellitus in pregnancy, unspecified control: Secondary | ICD-10-CM

## 2022-09-24 LAB — CBC
Hematocrit: 34.3 % (ref 34.0–46.6)
Hemoglobin: 11.4 g/dL (ref 11.1–15.9)
MCH: 30.7 pg (ref 26.6–33.0)
MCHC: 33.2 g/dL (ref 31.5–35.7)
MCV: 93 fL (ref 79–97)
Platelets: 274 10*3/uL (ref 150–450)
RBC: 3.71 x10E6/uL — ABNORMAL LOW (ref 3.77–5.28)
RDW: 11.9 % (ref 11.7–15.4)
WBC: 9.1 10*3/uL (ref 3.4–10.8)

## 2022-09-24 LAB — GLUCOSE TOLERANCE, 2 HOURS W/ 1HR
Glucose, 1 hour: 168 mg/dL (ref 70–179)
Glucose, 2 hour: 153 mg/dL — ABNORMAL HIGH (ref 70–152)
Glucose, Fasting: 80 mg/dL (ref 70–91)

## 2022-09-24 LAB — RPR: RPR Ser Ql: NONREACTIVE

## 2022-09-24 LAB — HIV ANTIBODY (ROUTINE TESTING W REFLEX): HIV Screen 4th Generation wRfx: NONREACTIVE

## 2022-09-24 MED ORDER — ACCU-CHEK SOFTCLIX LANCETS MISC
12 refills | Status: DC
Start: 2022-09-24 — End: 2022-11-07
  Filled 2022-09-24: qty 100, 25d supply, fill #0

## 2022-09-24 MED ORDER — ACCU-CHEK GUIDE VI STRP
ORAL_STRIP | 12 refills | Status: DC
Start: 2022-09-24 — End: 2022-11-07
  Filled 2022-09-24: qty 100, 25d supply, fill #0

## 2022-09-24 MED ORDER — ACCU-CHEK GUIDE W/DEVICE KIT
1.0000 | PACK | 0 refills | Status: DC
Start: 2022-09-24 — End: 2022-11-07
  Filled 2022-09-24: qty 1, 30d supply, fill #0

## 2022-09-25 ENCOUNTER — Other Ambulatory Visit: Payer: Self-pay

## 2022-09-30 ENCOUNTER — Encounter: Payer: MEDICAID | Admitting: Obstetrics & Gynecology

## 2022-10-01 ENCOUNTER — Other Ambulatory Visit: Payer: Self-pay

## 2022-10-17 ENCOUNTER — Other Ambulatory Visit: Payer: Self-pay | Admitting: *Deleted

## 2022-10-17 ENCOUNTER — Ambulatory Visit: Payer: MEDICAID | Attending: Obstetrics

## 2022-10-17 DIAGNOSIS — Z79899 Other long term (current) drug therapy: Secondary | ICD-10-CM

## 2022-10-17 DIAGNOSIS — F419 Anxiety disorder, unspecified: Secondary | ICD-10-CM | POA: Diagnosis not present

## 2022-10-17 DIAGNOSIS — Z3A32 32 weeks gestation of pregnancy: Secondary | ICD-10-CM

## 2022-10-17 DIAGNOSIS — O283 Abnormal ultrasonic finding on antenatal screening of mother: Secondary | ICD-10-CM | POA: Insufficient documentation

## 2022-10-17 DIAGNOSIS — O24410 Gestational diabetes mellitus in pregnancy, diet controlled: Secondary | ICD-10-CM | POA: Diagnosis not present

## 2022-10-17 DIAGNOSIS — O99343 Other mental disorders complicating pregnancy, third trimester: Secondary | ICD-10-CM

## 2022-10-17 DIAGNOSIS — O35BXX0 Maternal care for other (suspected) fetal abnormality and damage, fetal cardiac anomalies, not applicable or unspecified: Secondary | ICD-10-CM

## 2022-10-21 ENCOUNTER — Encounter: Payer: MEDICAID | Admitting: Obstetrics and Gynecology

## 2022-10-21 ENCOUNTER — Other Ambulatory Visit: Payer: Self-pay

## 2022-10-21 DIAGNOSIS — O24419 Gestational diabetes mellitus in pregnancy, unspecified control: Secondary | ICD-10-CM

## 2022-10-30 ENCOUNTER — Ambulatory Visit: Payer: MEDICAID

## 2022-11-07 ENCOUNTER — Other Ambulatory Visit (HOSPITAL_COMMUNITY)
Admission: RE | Admit: 2022-11-07 | Discharge: 2022-11-07 | Disposition: A | Discharge status: Home / Self Care | Payer: MEDICAID | Source: Ambulatory Visit | Attending: Obstetrics & Gynecology | Admitting: Obstetrics & Gynecology

## 2022-11-07 ENCOUNTER — Other Ambulatory Visit: Payer: Self-pay

## 2022-11-07 ENCOUNTER — Ambulatory Visit (INDEPENDENT_AMBULATORY_CARE_PROVIDER_SITE_OTHER): Payer: MEDICAID | Admitting: Obstetrics and Gynecology

## 2022-11-07 VITALS — BP 119/79 | HR 95 | Wt 170.0 lb

## 2022-11-07 DIAGNOSIS — Z2839 Other underimmunization status: Secondary | ICD-10-CM

## 2022-11-07 DIAGNOSIS — B379 Candidiasis, unspecified: Secondary | ICD-10-CM

## 2022-11-07 DIAGNOSIS — N760 Acute vaginitis: Secondary | ICD-10-CM

## 2022-11-07 DIAGNOSIS — O09899 Supervision of other high risk pregnancies, unspecified trimester: Secondary | ICD-10-CM

## 2022-11-07 DIAGNOSIS — O283 Abnormal ultrasonic finding on antenatal screening of mother: Secondary | ICD-10-CM

## 2022-11-07 DIAGNOSIS — F411 Generalized anxiety disorder: Secondary | ICD-10-CM

## 2022-11-07 DIAGNOSIS — O24419 Gestational diabetes mellitus in pregnancy, unspecified control: Secondary | ICD-10-CM

## 2022-11-07 DIAGNOSIS — B9689 Other specified bacterial agents as the cause of diseases classified elsewhere: Secondary | ICD-10-CM

## 2022-11-07 DIAGNOSIS — O09893 Supervision of other high risk pregnancies, third trimester: Secondary | ICD-10-CM

## 2022-11-07 DIAGNOSIS — O0973 Supervision of high risk pregnancy due to social problems, third trimester: Secondary | ICD-10-CM

## 2022-11-07 DIAGNOSIS — O0972 Supervision of high risk pregnancy due to social problems, second trimester: Secondary | ICD-10-CM

## 2022-11-07 DIAGNOSIS — Z3A35 35 weeks gestation of pregnancy: Secondary | ICD-10-CM

## 2022-11-07 DIAGNOSIS — F3173 Bipolar disorder, in partial remission, most recent episode manic: Secondary | ICD-10-CM

## 2022-11-07 MED ORDER — ACCU-CHEK GUIDE W/DEVICE KIT: 1.0000 | PACK | 0 refills | Status: DC

## 2022-11-07 MED ORDER — ACCU-CHEK GUIDE VI STRP: ORAL_STRIP | 12 refills | Status: DC

## 2022-11-07 MED ORDER — ACCU-CHEK SOFTCLIX LANCETS MISC
12 refills | Status: DC
Start: 2022-11-07 — End: 2022-11-14

## 2022-11-07 MED ORDER — FLUCONAZOLE 150 MG PO TABS: 150.0000 mg | ORAL_TABLET | Freq: Once | ORAL | 1 refills | Status: DC

## 2022-11-07 NOTE — Progress Notes (Signed)
   PRENATAL VISIT NOTE  Subjective:  Susan Davis is a 27 y.o. G1P0 at [redacted]w[redacted]d being seen today for ongoing prenatal care.  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  Patient reports vaginal itching and discharge.Denies leaking of fluid.   The following portions of the patient's history were reviewed and updated as appropriate: allergies, current medications, past family history, past medical history, past social history, past surgical history and problem list.   Objective:   Vitals:   11/07/22 1556  BP: 119/79  Pulse: 95  Weight: 170 lb (77.1 kg)    Fetal Status: Fetal Heart Rate (bpm): 138   Movement: Present     General:  Alert, oriented and cooperative. Patient is in no acute distress.  Skin: Skin is warm and dry. No rash noted.   Cardiovascular: Normal heart rate noted  Respiratory: Normal respiratory effort, no problems with respiration noted  Abdomen: Soft, gravid, appropriate for gestational age.  Pain/Pressure: Present     Pelvic: Cervical exam deferred        Extremities: Normal range of motion.  Edema: None  Mental Status: Normal mood and affect. Normal behavior. Normal judgment and thought content.   Assessment and Plan:  Pregnancy: G1P0 at [redacted]w[redacted]d 1. Supervision of high risk pregnancy due to social problems in second trimester Bp and FHR normal Feeling regular fetal  movement  2. Rubella non-immune status, antepartum MMR pp   3. 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 Bipolar disorder, in partial remission, most recent episode manic (HCC) 5. GAD (generalized anxiety disorder) Followed by psych  6. [redacted] weeks gestation of pregnancy Tx sent for symptoms  7. Gestational diabetes mellitus (GDM) in third trimester, gestational diabetes method of control unspecified 10/3 u/s EFW 67%, afi normal,  follow up 10/31 Has not started checking sugars because she was unable to get supplies d/t living arrangement. Needs supplies sent to a different pharmacy. Discussed dietary changes and exercise to aid with diet control. Will star checking and bring log to next visit    Preterm labor symptoms and general obstetric precautions including but not limited to vaginal bleeding, contractions, leaking of fluid and fetal movement were reviewed in detail with the patient. Please refer to After Visit Summary for other counseling recommendations.   Return in about 1 week (around 11/14/2022) for OB VISIT (MD or APP).  Future Appointments  Date Time Provider Department Center  11/14/2022  1:15 PM Reva Bores, MD [facility]  11/14/2022  2:15 PM [facility] NURSE [facility]  11/14/2022  2:30 PM [facility] US2 [facility]  11/20/2022  8:30 AM Shanna Cisco, NP [facility] None    Albertine Grates, FNP

## 2022-11-11 LAB — CERVICOVAGINAL ANCILLARY ONLY
Bacterial Vaginitis (gardnerella): POSITIVE — AB
Candida Glabrata: NEGATIVE
Candida Vaginitis: POSITIVE — AB
Chlamydia: NEGATIVE
Comment: NEGATIVE
Comment: NEGATIVE
Comment: NEGATIVE
Comment: NEGATIVE
Comment: NEGATIVE
Comment: NORMAL
Neisseria Gonorrhea: NEGATIVE
Trichomonas: NEGATIVE

## 2022-11-13 ENCOUNTER — Other Ambulatory Visit: Payer: Self-pay | Admitting: Obstetrics and Gynecology

## 2022-11-13 MED ORDER — METRONIDAZOLE 500 MG PO TABS: 500.0000 mg | ORAL_TABLET | Freq: Two times a day (BID) | ORAL | 0 refills | Status: AC

## 2022-11-13 NOTE — Addendum Note (Signed)
Addended by: Sue Lush on: 11/13/2022 08:07 AM   Modules accepted: Orders

## 2022-11-14 ENCOUNTER — Inpatient Hospital Stay (HOSPITAL_COMMUNITY): Payer: MEDICAID

## 2022-11-14 ENCOUNTER — Encounter (HOSPITAL_COMMUNITY): Payer: Self-pay | Admitting: Obstetrics and Gynecology

## 2022-11-14 ENCOUNTER — Other Ambulatory Visit: Payer: Self-pay | Admitting: Maternal & Fetal Medicine

## 2022-11-14 ENCOUNTER — Ambulatory Visit: Payer: MEDICAID

## 2022-11-14 ENCOUNTER — Inpatient Hospital Stay (HOSPITAL_COMMUNITY)
Admission: AD | Admit: 2022-11-14 | Discharge: 2022-11-14 | Disposition: A | Discharge status: Home / Self Care | Payer: MEDICAID | Attending: Obstetrics and Gynecology | Admitting: Obstetrics and Gynecology

## 2022-11-14 ENCOUNTER — Other Ambulatory Visit: Payer: Self-pay

## 2022-11-14 ENCOUNTER — Ambulatory Visit: Payer: MEDICAID | Admitting: Family Medicine

## 2022-11-14 ENCOUNTER — Ambulatory Visit: Payer: MEDICAID | Admitting: *Deleted

## 2022-11-14 VITALS — BP 115/78 | HR 124 | Wt 169.5 lb

## 2022-11-14 VITALS — BP 122/74 | HR 113

## 2022-11-14 DIAGNOSIS — Z3A36 36 weeks gestation of pregnancy: Secondary | ICD-10-CM | POA: Insufficient documentation

## 2022-11-14 DIAGNOSIS — Z2839 Other underimmunization status: Secondary | ICD-10-CM | POA: Insufficient documentation

## 2022-11-14 DIAGNOSIS — O0972 Supervision of high risk pregnancy due to social problems, second trimester: Secondary | ICD-10-CM

## 2022-11-14 DIAGNOSIS — O35BXX0 Maternal care for other (suspected) fetal abnormality and damage, fetal cardiac anomalies, not applicable or unspecified: Secondary | ICD-10-CM

## 2022-11-14 DIAGNOSIS — O24415 Gestational diabetes mellitus in pregnancy, controlled by oral hypoglycemic drugs: Secondary | ICD-10-CM | POA: Insufficient documentation

## 2022-11-14 DIAGNOSIS — F419 Anxiety disorder, unspecified: Secondary | ICD-10-CM

## 2022-11-14 DIAGNOSIS — O43193 Other malformation of placenta, third trimester: Secondary | ICD-10-CM

## 2022-11-14 DIAGNOSIS — O283 Abnormal ultrasonic finding on antenatal screening of mother: Secondary | ICD-10-CM | POA: Insufficient documentation

## 2022-11-14 DIAGNOSIS — Z79899 Other long term (current) drug therapy: Secondary | ICD-10-CM

## 2022-11-14 DIAGNOSIS — O24419 Gestational diabetes mellitus in pregnancy, unspecified control: Secondary | ICD-10-CM | POA: Insufficient documentation

## 2022-11-14 DIAGNOSIS — F3173 Bipolar disorder, in partial remission, most recent episode manic: Secondary | ICD-10-CM | POA: Diagnosis not present

## 2022-11-14 DIAGNOSIS — O99343 Other mental disorders complicating pregnancy, third trimester: Secondary | ICD-10-CM | POA: Diagnosis not present

## 2022-11-14 DIAGNOSIS — O24410 Gestational diabetes mellitus in pregnancy, diet controlled: Secondary | ICD-10-CM

## 2022-11-14 DIAGNOSIS — O0973 Supervision of high risk pregnancy due to social problems, third trimester: Secondary | ICD-10-CM | POA: Diagnosis not present

## 2022-11-14 DIAGNOSIS — Z3689 Encounter for other specified antenatal screening: Secondary | ICD-10-CM | POA: Diagnosis not present

## 2022-11-14 DIAGNOSIS — O09893 Supervision of other high risk pregnancies, third trimester: Secondary | ICD-10-CM

## 2022-11-14 DIAGNOSIS — O288 Other abnormal findings on antenatal screening of mother: Secondary | ICD-10-CM

## 2022-11-14 DIAGNOSIS — O4703 False labor before 37 completed weeks of gestation, third trimester: Secondary | ICD-10-CM | POA: Diagnosis present

## 2022-11-14 DIAGNOSIS — O09899 Supervision of other high risk pregnancies, unspecified trimester: Secondary | ICD-10-CM | POA: Insufficient documentation

## 2022-11-14 DIAGNOSIS — Z3490 Encounter for supervision of normal pregnancy, unspecified, unspecified trimester: Secondary | ICD-10-CM

## 2022-11-14 LAB — GLUCOSE, CAPILLARY
Glucose-Capillary: 97 mg/dL (ref 70–99)
Glucose-Capillary: 92 mg/dL (ref 70–99)

## 2022-11-14 MED ORDER — ACCU-CHEK SOFTCLIX LANCETS MISC: 1.0000 | Freq: Four times a day (QID) | 2 refills | Status: DC

## 2022-11-14 MED ORDER — BLOOD GLUCOSE TEST VI STRP: 1.0000 | ORAL_STRIP | Freq: Three times a day (TID) | 0 refills | Status: DC

## 2022-11-14 NOTE — MAU Note (Signed)
.  Susan Davis is a 27 y.o. at [redacted]w[redacted]d here in MAU reporting: Sent over from office for failed NST and a BPP of 6/8 for tone. Plan to repeat BPP four hours from initial BPP as well as receive 4h of fetal monitoring. Denies VB or LOF. Denies pain. Reports DFM all day today. CBG checked in office and was 97. Last ate one hour ago.  Per MFM report in Epic: Recommendations  - Sent to MAU for 4 hours of fetal monitoring with repeat BPP.  IF the BPP is a 8/10 or better without decels she can be  discharged. IF the BPP is 6/10 or lower she will need in  patient management.  - Lab work for glucose assessment  GDM - diet controlled. Marginal insertion of cord.   Onset of complaint: Today Pain score: Denies pain. Lab orders placed from triage: none

## 2022-11-14 NOTE — Addendum Note (Signed)
Addended by: Marjo Bicker on: 11/14/2022 05:31 PM   Modules accepted: Orders

## 2022-11-14 NOTE — Progress Notes (Signed)
   PRENATAL VISIT NOTE  Subjective:  Susan Davis is a 27 y.o. G1P0 at [redacted]w[redacted]d being seen today for ongoing prenatal care.  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  Patient reports no complaints.  Contractions: Irregular. Vag. Bleeding: None.  Movement: Present. Denies leaking of fluid.   The following portions of the patient's history were reviewed and updated as appropriate: allergies, current medications, past family history, past medical history, past social history, past surgical history and problem list.   Objective:   Vitals:   11/14/22 1330  BP: 115/78  Pulse: (!) 124  Weight: 169 lb 8 oz (76.9 kg)    Fetal Status: Fetal Heart Rate (bpm): 135 Fundal Height: 36 cm Movement: Present  Presentation: Vertex  General:  Alert, oriented and cooperative. Patient is in no acute distress.  Skin: Skin is warm and dry. No rash noted.   Cardiovascular: Normal heart rate noted  Respiratory: Normal respiratory effort, no problems with respiration noted  Abdomen: Soft, gravid, appropriate for gestational age.  Pain/Pressure: Absent     Pelvic: Cervical exam deferred        Extremities: Normal range of motion.  Edema: None  Mental Status: Normal mood and affect. Normal behavior. Normal judgment and thought content.   Assessment and Plan:  Pregnancy: G1P0 at [redacted]w[redacted]d 1. Supervision of high  risk pregnancy due to social problems in second trimester Cultures today - Culture, beta strep (group b only)  2. Diet controlled gestational diabetes mellitus (GDM) in third trimester No ability to check sugars Random today is 97 in office Last u/s for growth @ 67% Following u/s for growth today, to decide about timing of delivery - discussed with MFM and due to congenital heart defect, recommend weekly testing and IOL @ 39 weeks--orders placed.  3. Bipolar disorder, in partial remission, most recent episode manic (HCC) On Zyprexa  4. Rubella non-immune status, antepartum MMR pp if she will take this  5. [redacted] weeks gestation of pregnancy   Preterm labor symptoms and general obstetric precautions including but not limited to vaginal bleeding, contractions, leaking of fluid and fetal movement were reviewed in detail with the patient. Please refer to After Visit Summary for other counseling recommendations.   Return in 1 week (on 11/21/2022).  Future Appointments  Date Time Provider Department Center  11/14/2022  2:15 PM [facility] NURSE [facility]  11/14/2022  2:30 PM [facility] US2 [facility]  11/20/2022  8:30 AM Shanna Cisco, NP [facility] None    Reva Bores, MD

## 2022-11-14 NOTE — MAU Provider Note (Signed)
History     CSN: [id number]  Arrival date and time: 11/14/22 1816   None     Chief Complaint  Patient presents with   Failed NST   Repeat BPP - 6/8 in office   HPI Susan Davis is a 27 y.o. G1P0 at [redacted]w[redacted]d who presents to MAU for prolonged monitoring after failed NST/BPP of 6/8 at MFM office earlier today. She denies any changes in her health. She has been having some very irregular Braxton Hicks contractions. Initially was having some DFM, but this improved after pt ate lunch prior arrival to MAU. She is now feeling baby move regularly. Denies VB, LOF.  Past Medical History:  Diagnosis Date   Anxiety    Bipolar 1 disorder (HCC)    Depression    Encounter for supervision of normal first pregnancy in first trimester 05/24/2022              NURSING     PROVIDER      Office Location    [facility]    Dating by    LMP c/w U/S at 9 wks      [facility]    Traditional    Anatomy U/S           Initiated care at     [employer]                           Language     English                     LAB RESULTS       Support Person    FOB- [employer] Mom     Genetics    NIPS:       low risk female      QIO:NGEXBM                      NT/IT (FT only)      Medical history non-contributory     Past Surgical History:  Procedure Laterality Date   NO PAST SURGERIES      Family History  Problem Relation Age of Onset   Hypertension Mother    Diabetes Mother     Social History   Tobacco Use   Smoking status: Never   Smokeless tobacco: Never  Vaping Use   Vaping status: Never Used  Substance Use Topics   Alcohol use: Not Currently    Comment: "occasionally", not since confirmed pregnancy   Drug use: Not Currently    Types: Marijuana    Comment: pt states its been 1-2 months, not since 04/14/22    Allergies:  Allergies  Allergen Reactions   Other Other (See Comments)    Allergic to Unknown Anxiety Medication which causes twitching (2021)  Regarding entry above- is this "Hydroxyzine"?? (2024)    Hydroxyzine Pamoate Swelling, Anxiety and Other (See Comments)    Shaking made feel "like having a panic attack"    Medications Prior to Admission  Medication Sig Dispense Refill Last Dose   OLANZapine (ZYPREXA) 20 MG tablet Take 1 tablet (20 mg total) by mouth at bedtime. 30 tablet 3 11/13/2022   Prenatal 28-0.8 MG TABS Take 1 tablet by mouth daily. 30 tablet 12 11/13/2022   Accu-Chek Softclix Lancets lancets Use as instructed (Patient not taking: Reported on 11/14/2022) 100 each 12    Blood Glucose Monitoring Suppl (ACCU-CHEK GUIDE) w/Device KIT Use as directed. (  Patient not taking: Reported on 11/14/2022) 1 kit 0    fluconazole (DIFLUCAN) 150 MG tablet Take 1 tablet (150 mg total) by mouth once for 1 dose. Can take additional dose three days later if symptoms persist *GENERIC DIFLUCAN* (Patient not taking: Reported on 11/14/2022) 1 tablet 0    glucose blood (ACCU-CHEK GUIDE) test strip Use as instructed (Patient not taking: Reported on 11/14/2022) 100 each 12    metroNIDAZOLE (FLAGYL) 500 MG tablet Take 1 tablet (500 mg total) by mouth 2 (two) times daily for 7 days. 14 tablet 0    sodium chloride (OCEAN) 0.65 % SOLN nasal spray Place 1 spray into both nostrils 2 (two) times daily as needed for congestion (and dry nose). 30 mL 1     Review of Systems Physical Exam   Blood pressure 126/80, pulse (!) 114, temperature 98 F (36.7 C), temperature source Oral, resp. rate 16, last menstrual period 03/01/2022, SpO2 100%.  Physical Exam Constitutional:      General: She is not in acute distress.    Appearance: Normal appearance. She is not ill-appearing.  HENT:     Head: Normocephalic and atraumatic.  Cardiovascular:     Rate and Rhythm: Normal rate.  Pulmonary:     Effort: Pulmonary effort is normal.     Breath sounds: Normal breath sounds.  Abdominal:     Palpations: Abdomen is soft.     Tenderness: There is no abdominal tenderness. There is no guarding.  Musculoskeletal:         General: Normal range of motion.  Skin:    General: Skin is warm and dry.     Findings: No rash.  Neurological:     General: No focal deficit present.     Mental Status: She is alert and oriented to person, place, and time.   EFM: 150/mod/+a/-d  MAU Course  Procedures  MDM 27 y.o. G1P0 at [redacted]w[redacted]d who presents for prolonged monitoring and BPP. Her pregnancy is complicated by bipolar disorder and A1GDM. She had failed NST and BPP of 6/8 earlier today so MFM recommendation was for 4hrs fetal monitoring, repeat BPP with plan -- BPP 8/10 or better w/o decels, ok for d/c but if BPP 6/10 or lower, needs inpt management. Plan discussed w pt and her partner.  9:49 PM  BPP 8/8, on EFM - 140/mod/+a/no decels. Pt feeling more FM. Reviewed strip w Dr. Macon Large. Stable for d/c. Return precautions discussed.  Assessment and Plan  NST (non-stress test) reactive - Plan: Discharge patient BPP 8/8, prolonged monitoring reassuring Stable for d/c  Patient discussed with Dr. Loreli Dollar 11/14/2022, 8:07 PM

## 2022-11-14 NOTE — Procedures (Signed)
Susan Davis May 18, 1995 [redacted]w[redacted]d  Fetus A Non-Stress Test Interpretation for 11/14/22-NST with BPP  Indication: Gestational Diabetes medication controlled and Unsatisfactory BPP  Fetal Heart Rate A Mode: External Baseline Rate (A): 155 bpm Variability: Moderate Accelerations: 10 x 10 Decelerations: None Multiple birth?: No  Uterine Activity Mode: Toco Contraction Frequency (min): occas with UI Contraction Duration (sec): 60 Contraction Quality: Mild Resting Tone Palpated: Relaxed  Interpretation (Fetal Testing) Nonstress Test Interpretation: Non-reactive Comments: Tracing reviewed by Dr. Darra Lis

## 2022-11-17 LAB — CULTURE, BETA STREP (GROUP B ONLY): Strep Gp B Culture: POSITIVE — AB

## 2022-11-18 ENCOUNTER — Encounter: Payer: Self-pay | Admitting: Family Medicine

## 2022-11-18 DIAGNOSIS — O99820 Streptococcus B carrier state complicating pregnancy: Secondary | ICD-10-CM | POA: Insufficient documentation

## 2022-11-19 ENCOUNTER — Encounter: Payer: Self-pay | Admitting: General Practice

## 2022-11-20 ENCOUNTER — Encounter (HOSPITAL_COMMUNITY): Payer: Self-pay | Admitting: Psychiatry

## 2022-11-20 ENCOUNTER — Telehealth (INDEPENDENT_AMBULATORY_CARE_PROVIDER_SITE_OTHER): Payer: MEDICAID | Admitting: Psychiatry

## 2022-11-20 DIAGNOSIS — F3173 Bipolar disorder, in partial remission, most recent episode manic: Secondary | ICD-10-CM

## 2022-11-20 MED ORDER — OLANZAPINE 20 MG PO TABS
20.0000 mg | ORAL_TABLET | Freq: Every day | ORAL | 3 refills | Status: DC
  Filled 2022-12-10: qty 30, 30d supply, fill #0

## 2022-11-20 NOTE — Progress Notes (Signed)
BH MD/PA/NP OP Progress Note. Virtual Visit via Video Note  I connected with Susan Davis on 11/20/22 at  8:30 AM EST by a video enabled telemedicine application and verified that I am speaking with the correct person using two identifiers.  Location: Patient: Car Provider: Clinic   I discussed the limitations of evaluation and management by telemedicine and the availability of in person appointments. The patient expressed understanding and agreed to proceed.  I provided 30 minutes of non-face-to-face time during this encounter.          11/20/2022 8:46 AM Susan Davis  MRN:  [id number]  Chief Complaint: "Can we increase my Zyprexa once the baby is born"   HPI: 27 year old female seen today for follow up psychiatric evaluation.   She has a psychiatric history of bipolar disorder, anxiety, and depression.  Patients currently managed on Zyprexa  20 mg nightly. She notes that her medication is somewhat effective in managing her psychiatric conditions.   Today she was well groomed,  pleasant, cooperative, and engaged in conversation.  She informed Clinical research associate that she would like her Zyprexa increased after the birth of her son.  She also notes that she is open to adding an antidepressant to help manage her anxiety and depression. Patient reports that she has been anxious about her health and being a good mother.  Today provider conducted a GAD 7 and patient scored an 8, at her last visit he scored a 4. Provider also conducted a PHQ 9 and patient scored a 7, at her last visit she scored a 4. Today she denies SI/HI/VAH or paranoia. Patient notes that at times she is irritable, distractible, and has racing thoughts.   Patient will follow up in 5 weeks to readdress her mental health. She will be a few  weeks post partum. If needed medications will be adjusted. No medication changes made today.  Patient agreeable to continue medications as prescribed.  No other concerns noted at this  time.   Visit Diagnosis:    ICD-10-CM   1. Bipolar disorder, in partial remission, most recent episode manic (HCC)  F31.73 OLANZapine (ZYPREXA) 20 MG tablet         Past Psychiatric History: Depression, Anxiety, and Bipolar affective Past Medical History:  Past Medical History:  Diagnosis Date   Anxiety    Bipolar 1 disorder (HCC)    Depression    Encounter for supervision of normal first pregnancy in first trimester 05/24/2022              NURSING     PROVIDER      Office Location    [facility]    Dating by    LMP c/w U/S at 9 wks      [facility]    Traditional    Anatomy U/S           Initiated care at     [employer]     English                     LAB RESULTS       Support Person    FOB- Penni Bombard and Mom     Genetics    NIPS:       low risk female      WUJ:[id number]  NT/IT (FT only)      Medical history non-contributory     Past Surgical History:  Procedure Laterality Date   NO PAST SURGERIES      Family Psychiatric History: Patient notes that several people in her family suffer from anxiety and depression however she could not give provider specifics of who they were.    Family History:  Family History  Problem Relation Age of Onset   Hypertension Mother    Diabetes Mother     Social History:  Social History   Socioeconomic History   Marital status: Single    Spouse name: Not on file   Number of children: Not on file   Years of education: Not on file   Highest education level: Not on file  Occupational History   Not on file  Tobacco Use   Smoking status: Never   Smokeless tobacco: Never  Vaping Use   Vaping status: Never Used  Substance and Sexual Activity   Alcohol use: Not Currently    Comment: "occasionally", not since confirmed pregnancy   Drug use: Not Currently    Types: Marijuana    Comment: pt states its been 1-2 months, not since 04/14/22   Sexual activity: Yes    Partners: Male    Birth control/protection:  None  Other Topics Concern   Not on file  Social History Narrative   Not on file   Social Determinants of Health   Financial Resource Strain: Not on file  Food Insecurity: No Food Insecurity (04/03/2022)   Hunger Vital Sign    Worried About Running Out of Food in the Last Year: Never true    Ran Out of Food in the Last Year: Never true  Transportation Needs: No Transportation Needs (04/03/2022)   PRAPARE - Administrator, Civil Service (Medical): No    Lack of Transportation (Non-Medical): No  Physical Activity: Not on file  Stress: Not on file  Social Connections: Not on file    Allergies:  Allergies  Allergen Reactions   Other Other (See Comments)    Allergic to Unknown Anxiety Medication which causes twitching (2021)  Regarding entry above- is this "Hydroxyzine"?? (2024)   Hydroxyzine Pamoate Swelling, Anxiety and Other (See Comments)    Shaking made feel "like having a panic attack"    Metabolic Disorder Labs: Lab Results  Component Value Date   HGBA1C 5.5 04/04/2022   MPG 111 04/04/2022   Lab Results  Component Value Date   PROLACTIN 23.0 09/24/2021   PROLACTIN 52.1 (H) 08/14/2020   Lab Results  Component Value Date   CHOL 125 04/04/2022   TRIG 72 04/04/2022   HDL 58 04/04/2022   CHOLHDL 2.2 04/04/2022   VLDL 14 04/04/2022   LDLCALC 53 04/04/2022   LDLCALC 81 05/19/2018   Lab Results  Component Value Date   TSH 1.010 04/04/2022   TSH 1.430 09/24/2021    Therapeutic Level Labs: No results found for: "LITHIUM" Lab Results  Component Value Date   VALPROATE <10 (L) 04/05/2022   VALPROATE 29 (L) 09/24/2021   No results found for: "CBMZ"  Current Medications: Current Outpatient Medications  Medication Sig Dispense Refill   Accu-Chek Softclix Lancets lancets 1 each by Other route 4 (four) times daily. 100 each 2   Blood Glucose Monitoring Suppl (ACCU-CHEK GUIDE) w/Device KIT Use as directed. (Patient not taking: Reported on 11/14/2022) 1  kit 0   Glucose Blood (BLOOD GLUCOSE TEST STRIPS) STRP 1 each by In Vitro  route 3 (three) times daily with meals. May substitute to any manufacturer covered by patient's insurance. 100 strip 0   metroNIDAZOLE (FLAGYL) 500 MG tablet Take 1 tablet (500 mg total) by mouth 2 (two) times daily for 7 days. 14 tablet 0   OLANZapine (ZYPREXA) 20 MG tablet Take 1 tablet (20 mg total) by mouth at bedtime. 30 tablet 3   Prenatal 28-0.8 MG TABS Take 1 tablet by mouth daily. 30 tablet 12   sodium chloride (OCEAN) 0.65 % SOLN nasal spray Place 1 spray into both nostrils 2 (two) times daily as needed for congestion (and dry nose). 30 mL 1   No current facility-administered medications for this visit.     Musculoskeletal: Strength & Muscle Tone: within normal limits and Telehealth visit Gait & Station: normal, Telehealth visit Patient leans: N/A  Psychiatric Specialty Exam: Review of Systems  Last menstrual period 03/01/2022.There is no height or weight on file to calculate BMI.  General Appearance: Well Groomed  Eye Contact:  Good  Speech:  Clear and Coherent and Normal Rate  Volume:  Normal  Mood:   Mild anxiety and depression ,   Affect:  Appropriate and Congruent  Thought Process:  Coherent, Goal Directed and Linear  Orientation:  Full (Time, Place, and Person)  Thought Content: WDL and Logical   Suicidal Thoughts:  No  Homicidal Thoughts:  No  Memory:  Immediate;   Good Recent;   Good Remote;   Good  Judgement:  Good  Insight:  Good  Psychomotor Activity:  Normal  Concentration:  Concentration: Good and Attention Span: Good  Recall:  Good  Fund of Knowledge: Good  Language: Good  Akathisia:  No  Handed:  Right  AIMS (if indicated): Not done  Assets:  Communication Skills Desire for Improvement Financial Resources/Insurance Housing Social Support  ADL's:  Intact  Cognition: WNL  Sleep:  Good   Screenings: AIMS    Flowsheet Row [facility]) [facility]  AIMS Total Score 0 0      AUDIT    Flowsheet Row [facility]) [facility]  Alcohol Use Disorder Identification Test Final Score (AUDIT) 6 0      GAD-7    Flowsheet Row Video Visit from 11/20/2022 in [facility] Video Visit from 09/05/2022 in [facility] Initial Prenatal from 06/24/2022 in [facility] Video Visit from 06/12/2022 in [facility] Video Visit from 10/09/2021 in [facility]  Total GAD-7 Score 8 4 2  0 9      PHQ2-9    Flowsheet Row Video Visit from 11/20/2022 in [facility] Video Visit from 09/05/2022 in [facility] Initial Prenatal from 06/24/2022 in [facility] Video Visit from 06/12/2022 in [facility] Video Visit from 10/09/2021 in [facility]  PHQ-2 Total Score 4 2 1  0 2  PHQ-9 Total Score 7 4 2 1 11       Flowsheet Row Video Visit from 11/20/2022 in [facility] Admission (Discharged) from 11/14/2022 in [place] [facility] Ad[facility] (Dis[facility]  C-SSRS RISK CATEGORY No Risk No Risk No Risk        Assessment and Plan: Patient reports she has been more anxious, irritable, and depressed due to situational stressors. Patient will follow up in 5 weeks to readdress her mental health. She will be a few  weeks post partum. If needed medications will be adjusted. No  medication changes made today.  Patient agreeable to continue medications as prescribed.  1. Bipolar disorder, in partial remission, most recent episode manic (HCC)  Continue- OLANZapine (ZYPREXA) 20 MG tablet; Take 1 tablet (20 mg total) by mouth at bedtime.  Dispense: 30 tablet; Refill: 3  Follow up in 5 weeks  Shanna Cisco, NP 11/20/2022, 8:46 AM

## 2022-11-21 ENCOUNTER — Encounter: Payer: MEDICAID | Admitting: Obstetrics and Gynecology

## 2022-11-21 ENCOUNTER — Other Ambulatory Visit: Payer: Self-pay

## 2022-11-21 ENCOUNTER — Ambulatory Visit (INDEPENDENT_AMBULATORY_CARE_PROVIDER_SITE_OTHER): Payer: MEDICAID | Admitting: Obstetrics and Gynecology

## 2022-11-21 ENCOUNTER — Ambulatory Visit (INDEPENDENT_AMBULATORY_CARE_PROVIDER_SITE_OTHER): Payer: MEDICAID

## 2022-11-21 VITALS — BP 119/81 | HR 89 | Wt 177.1 lb

## 2022-11-21 DIAGNOSIS — O99820 Streptococcus B carrier state complicating pregnancy: Secondary | ICD-10-CM

## 2022-11-21 DIAGNOSIS — O0972 Supervision of high risk pregnancy due to social problems, second trimester: Secondary | ICD-10-CM | POA: Diagnosis not present

## 2022-11-21 DIAGNOSIS — Z2839 Other underimmunization status: Secondary | ICD-10-CM

## 2022-11-21 DIAGNOSIS — O24410 Gestational diabetes mellitus in pregnancy, diet controlled: Secondary | ICD-10-CM

## 2022-11-21 DIAGNOSIS — Z3A37 37 weeks gestation of pregnancy: Secondary | ICD-10-CM | POA: Diagnosis not present

## 2022-11-21 DIAGNOSIS — O09899 Supervision of other high risk pregnancies, unspecified trimester: Secondary | ICD-10-CM | POA: Diagnosis not present

## 2022-11-21 DIAGNOSIS — O0973 Supervision of high risk pregnancy due to social problems, third trimester: Secondary | ICD-10-CM

## 2022-11-21 DIAGNOSIS — F3173 Bipolar disorder, in partial remission, most recent episode manic: Secondary | ICD-10-CM

## 2022-11-21 LAB — GLUCOSE, CAPILLARY: Glucose-Capillary: 111 mg/dL — ABNORMAL HIGH (ref 70–99)

## 2022-11-21 NOTE — Progress Notes (Addendum)
   PRENATAL VISIT NOTE  Subjective:  Susan Davis is a 27 y.o. G1P0 at [redacted]w[redacted]d being seen today for ongoing prenatal care.  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  Patient reports no complaints.  Contractions: Not present. Vag. Bleeding: None.  Movement: Present. Denies leaking of fluid.   The following portions of the patient's history were reviewed and updated as appropriate: allergies, current medications, past family history, past medical history, past social history, past surgical history and problem list.   Objective:   Vitals:   11/21/22 1330  BP: 119/81  Pulse: 89  Weight: 177 lb 1.6 oz (80.3 kg)    Fetal Status: Fetal Heart Rate (bpm): 165   Movement: Present     General:  Alert, oriented and cooperative. Patient is in no acute distress.  Skin: Skin is warm and dry. No rash noted.   Cardiovascular: Normal heart rate noted  Respiratory: Normal respiratory effort, no problems with respiration noted  Abdomen: Soft, gravid, appropriate for gestational age.  Pain/Pressure: Present     Pelvic: Cervical exam deferred        Extremities: Normal range of motion.  Edema: None  Mental Status: Normal mood and affect. Normal behavior. Normal judgment and thought content.   Assessment and Plan:  Pregnancy: G1P0 at  [redacted]w[redacted]d 1. Supervision of high risk pregnancy due to social problems in second trimester BP and FHR normal Doing well, feeling regular movement   2. Diet controlled gestational diabetes mellitus (GDM) in third trimester Unable to check sugars Randon cbg today 111 Last u/s for growth @ 79% w/ bpp 6/10, went to [facility] for obs, d/c with bpp 8/8 11/1 BPP 8/8, another BPP scheduled for later today IOL per MFM scheduled for 39 weeks  3. Bipolar disorder, in partial remission, most recent episode manic (HCC) Stable on zyprexa, followed by BH  4. Rubella non-immune status, antepartum MMR pp   5. Group B Streptococcus carrier, +RV culture, currently pregnant Will tx in labor   6. 37 weeks of pregnancy Note provided for IOL    Term labor symptoms and general obstetric precautions including but not limited to vaginal bleeding, contractions, leaking of fluid and fetal movement were reviewed in detail with the patient. Please refer to After Visit Summary for other counseling recommendations.   Return in one week for routine prenatal   Future Appointments  Date Time Provider Department Center  11/21/2022  3:55 PM [facility] US2 [facility]  11/28/2022  3:45 PM [facility] US2 [facility]  11/28/2022  4:15 PM Milas Hock, MD [facility]  12/02/2022  7:00 AM [facility] SCHED ROOM [facility] None  12/26/2022  2:00 PM Shanna Cisco, NP [facility] None    Albertine Grates, FNP

## 2022-11-21 NOTE — Progress Notes (Signed)
Pt states has not been logging glucose numbers

## 2022-11-25 ENCOUNTER — Telehealth (HOSPITAL_COMMUNITY): Payer: Self-pay | Admitting: *Deleted

## 2022-11-25 ENCOUNTER — Encounter (HOSPITAL_COMMUNITY): Payer: Self-pay | Admitting: *Deleted

## 2022-11-25 NOTE — Telephone Encounter (Signed)
Preadmission screen  

## 2022-11-26 ENCOUNTER — Other Ambulatory Visit: Payer: Self-pay | Admitting: Advanced Practice Midwife

## 2022-11-27 NOTE — Progress Notes (Unsigned)
   PRENATAL VISIT NOTE  Subjective:  Susan Davis is a 27 y.o. G1P0 at [redacted]w[redacted]d being seen today for ongoing prenatal care.  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  Patient reports {[id number]}.   .  .   . Denies leaking of fluid.   The following portions of the patient's history were reviewed and updated as appropriate: allergies, current medications, past family history, past medical history, past social history, past surgical history and problem list.   Objective:  There were no vitals filed for this visit.  Fetal Status:           General:  Alert, oriented and cooperative. Patient is in no acute distress.  Skin: Skin is warm and dry. No rash noted.   Cardiovascular: Normal heart rate noted  Respiratory: Normal respiratory effort, no problems with respiration noted  Abdomen: Soft, gravid, appropriate for gestational age.        Pelvic: Cervical exam deferred        Extremities: Normal range of motion.     Mental Status: Normal mood and affect. Normal behavior. Normal judgment and thought content.   Assessment and Plan:  Pregnancy: G1P0 at [redacted]w[redacted]d 1. Diet controlled gestational diabetes mellitus (GDM) in third trimester CBGs: *** BPP today *** Growth normal on 10/31 - normal AC and AFI IOL scheduled for 11/18.   2.  Supervision of high risk pregnancy due to social problems in second trimester GBS pos.   3. Severe manic bipolar 1 disorder with psychotic behavior (HCC) On Zyprexa, followed by BH  4. Rubella non-immune status, antepartum MMR after delivery  5. Group B Streptococcus carrier, +RV culture, currently pregnant PCN in labor  6. 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 Pregnancy with 38 completed weeks gestation ***  Term labor symptoms and general obstetric precautions including but not limited to vaginal bleeding, contractions, leaking of fluid and fetal movement were reviewed in detail with the patient. Please refer to After Visit Summary for other counseling recommendations.   No follow-ups on file.  Future Appointments  Date Time Provider Department Center  11/28/2022  3:45 PM [facility] US2 [facility]  11/28/2022  4:15 PM Milas Hock, MD [facility]  12/02/2022  7:00 AM [facility] SCHED ROOM [facility] None  12/26/2022  2:00 PM Shanna Cisco, NP [facility] None    Milas Hock, MD

## 2022-11-28 ENCOUNTER — Other Ambulatory Visit: Payer: MEDICAID

## 2022-11-28 ENCOUNTER — Encounter: Payer: Self-pay | Admitting: Obstetrics and Gynecology

## 2022-11-28 ENCOUNTER — Ambulatory Visit (INDEPENDENT_AMBULATORY_CARE_PROVIDER_SITE_OTHER): Payer: MEDICAID | Admitting: Obstetrics and Gynecology

## 2022-11-28 ENCOUNTER — Ambulatory Visit: Payer: MEDICAID

## 2022-11-28 ENCOUNTER — Other Ambulatory Visit: Payer: Self-pay

## 2022-11-28 VITALS — BP 130/83 | HR 73 | Wt 177.0 lb

## 2022-11-28 DIAGNOSIS — Z2839 Other underimmunization status: Secondary | ICD-10-CM

## 2022-11-28 DIAGNOSIS — Z3A38 38 weeks gestation of pregnancy: Secondary | ICD-10-CM | POA: Diagnosis not present

## 2022-11-28 DIAGNOSIS — O0973 Supervision of high risk pregnancy due to social problems, third trimester: Secondary | ICD-10-CM

## 2022-11-28 DIAGNOSIS — O99820 Streptococcus B carrier state complicating pregnancy: Secondary | ICD-10-CM

## 2022-11-28 DIAGNOSIS — O24410 Gestational diabetes mellitus in pregnancy, diet controlled: Secondary | ICD-10-CM

## 2022-11-28 DIAGNOSIS — O09893 Supervision of other high risk pregnancies, third trimester: Secondary | ICD-10-CM

## 2022-11-28 DIAGNOSIS — O0972 Supervision of high risk pregnancy due to social problems, second trimester: Secondary | ICD-10-CM

## 2022-11-28 DIAGNOSIS — F312 Bipolar disorder, current episode manic severe with psychotic features: Secondary | ICD-10-CM

## 2022-11-28 DIAGNOSIS — O283 Abnormal ultrasonic finding on antenatal screening of mother: Secondary | ICD-10-CM

## 2022-11-28 MED ORDER — SALINE SPRAY 0.65 % NA SOLN: 1.0000 | Freq: Two times a day (BID) | NASAL | 1 refills | Status: DC | PRN

## 2022-12-02 ENCOUNTER — Inpatient Hospital Stay (HOSPITAL_COMMUNITY): Payer: MEDICAID

## 2022-12-02 ENCOUNTER — Other Ambulatory Visit: Payer: Self-pay

## 2022-12-02 ENCOUNTER — Inpatient Hospital Stay (HOSPITAL_COMMUNITY): Payer: MEDICAID | Admitting: Anesthesiology

## 2022-12-02 ENCOUNTER — Inpatient Hospital Stay (HOSPITAL_COMMUNITY)
Admission: RE | Admit: 2022-12-02 | Discharge: 2022-12-04 | DRG: 807 | Disposition: A | Discharge status: Home / Self Care | Payer: MEDICAID | Attending: Obstetrics and Gynecology | Admitting: Obstetrics and Gynecology

## 2022-12-02 ENCOUNTER — Encounter (HOSPITAL_COMMUNITY): Payer: Self-pay | Admitting: Family Medicine

## 2022-12-02 DIAGNOSIS — Z8249 Family history of ischemic heart disease and other diseases of the circulatory system: Secondary | ICD-10-CM

## 2022-12-02 DIAGNOSIS — O99344 Other mental disorders complicating childbirth: Secondary | ICD-10-CM | POA: Diagnosis present

## 2022-12-02 DIAGNOSIS — O99824 Streptococcus B carrier state complicating childbirth: Secondary | ICD-10-CM | POA: Diagnosis present

## 2022-12-02 DIAGNOSIS — O133 Gestational [pregnancy-induced] hypertension without significant proteinuria, third trimester: Secondary | ICD-10-CM | POA: Diagnosis not present

## 2022-12-02 DIAGNOSIS — Z3A39 39 weeks gestation of pregnancy: Secondary | ICD-10-CM

## 2022-12-02 DIAGNOSIS — Z2839 Other underimmunization status: Principal | ICD-10-CM

## 2022-12-02 DIAGNOSIS — Z8632 Personal history of gestational diabetes: Secondary | ICD-10-CM | POA: Diagnosis present

## 2022-12-02 DIAGNOSIS — Z833 Family history of diabetes mellitus: Secondary | ICD-10-CM | POA: Diagnosis not present

## 2022-12-02 DIAGNOSIS — F411 Generalized anxiety disorder: Secondary | ICD-10-CM | POA: Diagnosis present

## 2022-12-02 DIAGNOSIS — O24419 Gestational diabetes mellitus in pregnancy, unspecified control: Secondary | ICD-10-CM | POA: Diagnosis present

## 2022-12-02 DIAGNOSIS — Z888 Allergy status to other drugs, medicaments and biological substances status: Secondary | ICD-10-CM

## 2022-12-02 DIAGNOSIS — O24410 Gestational diabetes mellitus in pregnancy, diet controlled: Secondary | ICD-10-CM

## 2022-12-02 DIAGNOSIS — O134 Gestational [pregnancy-induced] hypertension without significant proteinuria, complicating childbirth: Secondary | ICD-10-CM | POA: Diagnosis present

## 2022-12-02 DIAGNOSIS — O24420 Gestational diabetes mellitus in childbirth, diet controlled: Secondary | ICD-10-CM | POA: Diagnosis not present

## 2022-12-02 DIAGNOSIS — F3173 Bipolar disorder, in partial remission, most recent episode manic: Secondary | ICD-10-CM | POA: Diagnosis present

## 2022-12-02 DIAGNOSIS — O09899 Supervision of other high risk pregnancies, unspecified trimester: Principal | ICD-10-CM

## 2022-12-02 DIAGNOSIS — O99820 Streptococcus B carrier state complicating pregnancy: Secondary | ICD-10-CM

## 2022-12-02 LAB — COMPREHENSIVE METABOLIC PANEL
ALT: 26 U/L (ref 0–44)
AST: 21 U/L (ref 15–41)
Albumin: 2.8 g/dL — ABNORMAL LOW (ref 3.5–5.0)
Alkaline Phosphatase: 115 U/L (ref 38–126)
Anion gap: 8 (ref 5–15)
BUN: <5 mg/dL — ABNORMAL LOW (ref 6–20)
CO2: 20 mmol/L — ABNORMAL LOW (ref 22–32)
Calcium: 9.2 mg/dL (ref 8.9–10.3)
Chloride: 108 mmol/L (ref 98–111)
Creatinine, Ser: 0.74 mg/dL (ref 0.44–1.00)
GFR, Estimated: >60 mL/min (ref >60)
Glucose, Bld: 98 mg/dL (ref 70–99)
Potassium: 3.8 mmol/L (ref 3.5–5.1)
Sodium: 136 mmol/L (ref 135–145)
Total Bilirubin: 0.5 mg/dL (ref <1.2)
Total Protein: 6.9 g/dL (ref 6.5–8.1)

## 2022-12-02 LAB — CBC
HCT: 36.3 % (ref 36.0–46.0)
HCT: 35.9 % — ABNORMAL LOW (ref 36.0–46.0)
Hemoglobin: 12.2 g/dL (ref 12.0–15.0)
Hemoglobin: 11.9 g/dL — ABNORMAL LOW (ref 12.0–15.0)
MCH: 30.2 pg (ref 26.0–34.0)
MCH: 29.6 pg (ref 26.0–34.0)
MCHC: 33.6 g/dL (ref 30.0–36.0)
MCHC: 33.1 g/dL (ref 30.0–36.0)
MCV: 89.9 fL (ref 80.0–100.0)
MCV: 89.3 fL (ref 80.0–100.0)
Platelets: 258 10*3/uL (ref 150–400)
Platelets: 245 10*3/uL (ref 150–400)
RBC: 4.04 MIL/uL (ref 3.87–5.11)
RBC: 4.02 MIL/uL (ref 3.87–5.11)
RDW: 13.2 % (ref 11.5–15.5)
RDW: 13.2 % (ref 11.5–15.5)
WBC: 8.6 10*3/uL (ref 4.0–10.5)
WBC: 9.9 10*3/uL (ref 4.0–10.5)
nRBC: 0 % (ref 0.0–0.2)
nRBC: 0 % (ref 0.0–0.2)

## 2022-12-02 LAB — PROTEIN / CREATININE RATIO, URINE
Creatinine, Urine: 40 mg/dL
Total Protein, Urine: <6 mg/dL

## 2022-12-02 LAB — GLUCOSE, CAPILLARY
Glucose-Capillary: 88 mg/dL (ref 70–99)
Glucose-Capillary: 74 mg/dL (ref 70–99)
Glucose-Capillary: 75 mg/dL (ref 70–99)
Glucose-Capillary: 94 mg/dL (ref 70–99)

## 2022-12-02 LAB — TYPE AND SCREEN
ABO/RH(D): B POS
Antibody Screen: NEGATIVE

## 2022-12-02 LAB — RPR: RPR Ser Ql: NONREACTIVE

## 2022-12-02 MED ORDER — TERBUTALINE SULFATE 1 MG/ML IJ SOLN: 0.2500 mg | Freq: Once | INTRAMUSCULAR | Status: DC | PRN

## 2022-12-02 MED ORDER — ONDANSETRON HCL 4 MG/2ML IJ SOLN
4.0000 mg | Freq: Four times a day (QID) | INTRAMUSCULAR | Status: DC | PRN
  Administered 2022-12-02: 4 mg via INTRAVENOUS
  Filled 2022-12-02: qty 2

## 2022-12-02 MED ORDER — ACETAMINOPHEN 325 MG PO TABS
650.0000 mg | ORAL_TABLET | ORAL | Status: DC | PRN
Start: 2022-12-02 — End: 2022-12-03

## 2022-12-02 MED ORDER — EPHEDRINE 5 MG/ML INJ: 10.0000 mg | INTRAVENOUS | Status: DC | PRN

## 2022-12-02 MED ORDER — LIDOCAINE-EPINEPHRINE (PF) 2 %-1:200000 IJ SOLN
INTRAMUSCULAR | Status: DC | PRN
  Administered 2022-12-02: 5 mL via EPIDURAL

## 2022-12-02 MED ORDER — OXYTOCIN-SODIUM CHLORIDE 30-0.9 UT/500ML-% IV SOLN
2.5000 [IU]/h | INTRAVENOUS | Status: DC
  Administered 2022-12-02: 2.5 [IU]/h via INTRAVENOUS
  Filled 2022-12-02: qty 500

## 2022-12-02 MED ORDER — OXYTOCIN-SODIUM CHLORIDE 30-0.9 UT/500ML-% IV SOLN
1.0000 m[IU]/min | INTRAVENOUS | Status: DC
Start: 2022-12-02 — End: 2022-12-03
  Administered 2022-12-02: 2 m[IU]/min via INTRAVENOUS

## 2022-12-02 MED ORDER — FLEET ENEMA RE ENEM: 1.0000 | ENEMA | RECTAL | Status: DC | PRN

## 2022-12-02 MED ORDER — MISOPROSTOL 25 MCG QUARTER TABLET
25.0000 ug | ORAL_TABLET | Freq: Once | ORAL | Status: AC
  Administered 2022-12-02: 25 ug via VAGINAL
  Filled 2022-12-02: qty 1

## 2022-12-02 MED ORDER — PHENYLEPHRINE 80 MCG/ML (10ML) SYRINGE FOR IV PUSH (FOR BLOOD PRESSURE SUPPORT)
80.0000 ug | PREFILLED_SYRINGE | INTRAVENOUS | Status: DC | PRN
  Filled 2022-12-02: qty 10

## 2022-12-02 MED ORDER — SOD CITRATE-CITRIC ACID 500-334 MG/5ML PO SOLN
30.0000 mL | ORAL | Status: DC | PRN
Start: 2022-12-02 — End: 2022-12-03

## 2022-12-02 MED ORDER — LIDOCAINE HCL (PF) 1 % IJ SOLN
30.0000 mL | INTRAMUSCULAR | Status: AC | PRN
  Administered 2022-12-02: 30 mL via SUBCUTANEOUS
  Filled 2022-12-02: qty 30

## 2022-12-02 MED ORDER — PHENYLEPHRINE 80 MCG/ML (10ML) SYRINGE FOR IV PUSH (FOR BLOOD PRESSURE SUPPORT): 80.0000 ug | PREFILLED_SYRINGE | INTRAVENOUS | Status: DC | PRN

## 2022-12-02 MED ORDER — SODIUM CHLORIDE 0.9 % IV SOLN
5.0000 10*6.[IU] | Freq: Once | INTRAVENOUS | Status: AC
  Administered 2022-12-02: 5 10*6.[IU] via INTRAVENOUS
  Filled 2022-12-02: qty 5

## 2022-12-02 MED ORDER — FENTANYL-BUPIVACAINE-NACL 0.5-0.125-0.9 MG/250ML-% EP SOLN
12.0000 mL/h | EPIDURAL | Status: DC | PRN
  Administered 2022-12-02: 12 mL/h via EPIDURAL
  Filled 2022-12-02: qty 250

## 2022-12-02 MED ORDER — LACTATED RINGERS IV SOLN
500.0000 mL | Freq: Once | INTRAVENOUS | Status: AC
  Administered 2022-12-02: 500 mL via INTRAVENOUS

## 2022-12-02 MED ORDER — MISOPROSTOL 50MCG HALF TABLET
50.0000 ug | ORAL_TABLET | ORAL | Status: DC
  Filled 2022-12-02: qty 1

## 2022-12-02 MED ORDER — MISOPROSTOL 50MCG HALF TABLET
50.0000 ug | ORAL_TABLET | Freq: Once | ORAL | Status: AC
  Administered 2022-12-02: 50 ug via ORAL
  Filled 2022-12-02: qty 1

## 2022-12-02 MED ORDER — LACTATED RINGERS IV SOLN: INTRAVENOUS | Status: DC

## 2022-12-02 MED ORDER — FENTANYL CITRATE (PF) 100 MCG/2ML IJ SOLN
50.0000 ug | INTRAMUSCULAR | Status: DC | PRN
  Administered 2022-12-02 (×3): 100 ug via INTRAVENOUS
  Filled 2022-12-02 (×3): qty 2

## 2022-12-02 MED ORDER — DIPHENHYDRAMINE HCL 50 MG/ML IJ SOLN: 12.5000 mg | INTRAMUSCULAR | Status: DC | PRN

## 2022-12-02 MED ORDER — PENICILLIN G POT IN DEXTROSE 60000 UNIT/ML IV SOLN
3.0000 10*6.[IU] | INTRAVENOUS | Status: DC
  Administered 2022-12-02 (×3): 3 10*6.[IU] via INTRAVENOUS
  Filled 2022-12-02 (×3): qty 50

## 2022-12-02 MED ORDER — OXYTOCIN BOLUS FROM INFUSION
333.0000 mL | Freq: Once | INTRAVENOUS | Status: AC
  Administered 2022-12-02: 333 mL via INTRAVENOUS

## 2022-12-02 MED ORDER — LACTATED RINGERS IV SOLN: 500.0000 mL | INTRAVENOUS | Status: DC | PRN

## 2022-12-02 NOTE — H&P (Signed)
OBSTETRIC ADMISSION HISTORY AND PHYSICAL  Susan Davis is a 27 y.o. female G1P0 with IUP at [redacted]w[redacted]d by early [place] @ 9wk presenting for IOL due to uncontrolled GDMA1.   She reports +FMs, No LOF, no VB, no blurry vision, headaches or peripheral edema, and RUQ pain.  She plans on bottle feeding. She not sure about contractepion  She received her prenatal care at  [facility]    Pregnancy complicated by:   -GDMA1- no meds, non-compliant -Bipolar d/o, Anxiety- on zyprexa -GBS positive -Fetal cardiac abnormalities- r-side aortic arch & vascular ring   Dating: By [name] [place] --->  Estimated Date of Delivery: 12/09/22  Sono:    @[redacted]w[redacted]d - EFW 7#1oz (3210g), 79%   Past Medical History: Past Medical History:  Diagnosis Date   Anxiety    Bipolar 1 disorder (HCC)    Depression    Encounter for supervision of normal first pregnancy in first trimester 05/24/2022              NURSING     PROVIDER      Office Location    [facility]    Dating by    LMP c/w U/S at 9 wks      [facility] Model    Traditional    Anatomy U/S           Initiated care at     [employer]     English                     LAB RESULTS       Support Person    FOB- Musician and Mom     Genetics    NIPS:       low risk female      ZOX:WRUEAV                      NT/IT (FT only)      Gestational diabetes    Medical history non-contributory     Past Surgical History: Past Surgical History:  Procedure Laterality Date   NO PAST SURGERIES      Obstetrical History: OB History     Gravida  1   Para      Term      Preterm      AB      Living         SAB      IAB      Ectopic      Multiple      Live Births              Social History Social History   Socioeconomic History   Marital status: Single    Spouse name: Not on file   Number of children: Not on file   Years of education: Not on file   Highest education level: Not on file  Occupational History   Not on file  Tobacco Use   Smoking  status: Never   Smokeless tobacco: Never  Vaping Use   Vaping status: Never Used  Substance and Sexual Activity   Alcohol use: Not Currently    Comment: "occasionally", not since confirmed pregnancy   Drug use: Not Currently    Types: Marijuana    Comment: pt states its been 1-2 months, not since 04/14/22   Sexual  activity: Yes    Partners: Male    Birth control/protection: None  Other Topics Concern   Not on file  Social History Narrative   Not on file   Social Determinants of Health   Financial Resource Strain: Not on file  Food Insecurity: No Food Insecurity (12/02/2022)   Hunger Vital Sign    Worried About Running Out of Food in the Last Year: Never true    Ran Out of Food in the Last Year: Never true  Transportation Needs: No Transportation Needs (12/02/2022)   PRAPARE - Administrator, Civil Service (Medical): No    Lack of Transportation (Non-Medical): No  Physical Activity: Not on file  Stress: Not on file  Social Connections: Not on file    Family History: Family History  Problem Relation Age of Onset   Hypertension Mother    Diabetes Mother     Allergies: Allergies  Allergen Reactions   Other Other (See Comments)    Allergic to Unknown Anxiety Medication which causes twitching (2021)  Regarding entry above- is this "Hydroxyzine"?? (2024)   Hydroxyzine Pamoate Swelling, Anxiety and Other (See Comments)    Shaking made feel "like having a panic attack"    Medications Prior to Admission  Medication Sig Dispense Refill Last Dose   Accu-Chek Softclix Lancets lancets 1 each by Other route 4 (four) times daily. 100 each 2    Blood Glucose Monitoring Suppl (ACCU-CHEK GUIDE) w/Device KIT Use as directed. 1 kit 0    Glucose Blood (BLOOD GLUCOSE TEST STRIPS) STRP 1 each by In Vitro route 3 (three) times daily with meals. May substitute to any manufacturer covered by patient's insurance. 100 strip 0    OLANZapine (ZYPREXA) 20 MG tablet Take 1 tablet  (20 mg total) by mouth at bedtime. 30 tablet 3    Prenatal 28-0.8 MG TABS Take 1 tablet by mouth daily. 30 tablet 12    sodium chloride (OCEAN) 0.65 % SOLN nasal spray Place 1 spray into both nostrils 2 (two) times daily as needed for congestion (and dry nose). 30 mL 1      Review of Systems   All systems reviewed and negative except as stated in HPI  Blood pressure 135/86, pulse 85, temperature 97.9 F (36.6 C), temperature source Oral, resp. rate 16, height 5' (1.524 m), weight 81.6 kg, last menstrual period 03/01/2022. General appearance: alert, cooperative, and no distress Lungs: Normal respiratory effort Heart: regular rate and rhythm Abdomen: gravid, soft and non-tender, Leopolds ~ 7# Pelvic: deferred Extremities: Homans sign is negative, no sign of DVT  Fetal monitoringBaseline: 135 bpm, Variability: moderate, Accelerations: +15x15 accels, and Decelerations: Absent Uterine activityNone     Prenatal labs: ABO, Rh: --/--/B POS (11/18 0724) Antibody: NEG (11/18 0724) Rubella: <0.90 (04/26 1936) RPR: Non Reactive (09/09 0906)  HBsAg: NON REACTIVE (04/26 1936)  HIV: Non Reactive (09/09 0906)  GBS: Positive/-- (10/31 0159)  1 hr Glucola abnormal Genetic screening  normal Anatomy US r-side aortic arch & vascular ring   Prenatal Transfer Tool  Maternal Diabetes: Yes:  Diabetes Type:  Diet controlled Genetic Screening: Normal Maternal Ultrasounds/Referrals: Fetal Heart Anomalies Fetal Ultrasounds or other Referrals:  Fetal echo Maternal Substance Abuse:  No Significant Maternal Medications:  Meds include: Other: zyprexa Significant Maternal Lab Results:  Group B Strep positive Number of Prenatal Visits:greater than 3 verified prenatal visits Other Comments:  None  Results for orders placed or performed during the hospital encounter of 12/02/22 (from the past 24  hour(s))  CBC   Collection Time: 12/02/22  7:24 AM  Result Value Ref Range   WBC 8.6 4.0 - 10.5 K/uL   RBC  4.04 3.87 - 5.11 MIL/uL   Hemoglobin 12.2 12.0 - 15.0 g/dL   HCT 40.9 81.1 - 91.4 %   MCV 89.9 80.0 - 100.0 fL   MCH 30.2 26.0 - 34.0 pg   MCHC 33.6 30.0 - 36.0 g/dL   RDW 78.2 95.6 - 21.3 %   Platelets 258 150 - 400 K/uL   nRBC 0.0 0.0 - 0.2 %  Type and screen   Collection Time: 12/02/22  7:24 AM  Result Value Ref Range   ABO/RH(D) B POS    Antibody Screen NEG    Sample Expiration      12/05/2022,2359 Performed at [facility], 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 G1P0 at [redacted]w[redacted]d here for IOL due to uncontrolled GDMA1  #GDMA1  Accucheck q 4hrs #Labor:plan for IOL with cytotec #Pain: IV or epidural upon request #FWB: Cat. I #ID:  PCN per protocol #MOF: bottle #MOC: not sure #Circ:  Yes  Myna Hidalgo, DO Attending Obstetrician & Gynecologist, [facility] Center for [employer], [facility]

## 2022-12-02 NOTE — Progress Notes (Signed)
OB/GYN Faculty Practice: Labor Progress Note  Subjective: Resting comfortably, starting to feel more painful contractions  Objective: BP 131/79   Pulse 78   Temp 98.3 F (36.8 C) (Oral)   Resp 18   Ht 5' (1.524 m)   Wt 81.6 kg   LMP 03/01/2022 (Exact Date)   BMI 35.11 kg/m  Gen: no acute complaints  Dilation: 3.5 Effacement (%): 70 Station: -2 Presentation: Vertex Exam by:: Dr. Charlotta Newton AROM clear fluid  Assessment and Plan: 27 y.o. G1P0 [redacted]w[redacted]d for IOL GDMA1  Labor: continue Pit per protocol, s/p AROM -- pain control: IV or epidural upon request  Fetal Well-Being: -- Category I - continuous fetal monitoring  -- GBS (positive) PCN per protocol -GDMA1- continue q 4hr checks  Myna Hidalgo, DO Attending Obstetrician & Gynecologist, [facility] for [employer], [facility]

## 2022-12-02 NOTE — Anesthesia Preprocedure Evaluation (Signed)
Anesthesia Evaluation  Patient identified by MRN, date of birth, ID band Patient awake    Reviewed: Allergy & Precautions, NPO status , Patient's Chart, lab work & pertinent test results  Airway Mallampati: II  TM Distance: >3 FB Neck ROM: Full    Dental no notable dental hx.    Pulmonary neg pulmonary ROS   Pulmonary exam normal breath sounds clear to auscultation       Cardiovascular negative cardio ROS Normal cardiovascular exam Rhythm:Regular Rate:Normal     Neuro/Psych  PSYCHIATRIC DISORDERS Anxiety Depression Bipolar Disorder   negative neurological ROS     GI/Hepatic negative GI ROS, Neg liver ROS,,,  Endo/Other  diabetes, Gestational    Renal/GU negative Renal ROS  negative genitourinary   Musculoskeletal negative musculoskeletal ROS (+)    Abdominal   Peds  Hematology negative hematology ROS (+)   Anesthesia Other Findings IOL for gDM  Reproductive/Obstetrics (+) Pregnancy                             Anesthesia Physical Anesthesia Plan  ASA: 2  Anesthesia Plan: Epidural   Post-op Pain Management:    Induction:   PONV Risk Score and Plan: Treatment may vary due to age or medical condition  Airway Management Planned: Natural Airway  Additional Equipment:   Intra-op Plan:   Post-operative Plan:   Informed Consent: I have reviewed the patients History and Physical, chart, labs and discussed the procedure including the risks, benefits and alternatives for the proposed anesthesia with the patient or authorized representative who has indicated his/her understanding and acceptance.       Plan Discussed with: Anesthesiologist  Anesthesia Plan Comments: (Patient identified. Risks, benefits, options discussed with patient including but not limited to bleeding, infection, nerve damage, paralysis, failed block, incomplete pain control, headache, blood pressure changes,  nausea, vomiting, reactions to medication, itching, and post partum back pain. Confirmed with bedside nurse the patient's most recent platelet count. Confirmed with the patient that they are not taking any anticoagulation, have any bleeding history or any family history of bleeding disorders. Patient expressed understanding and wishes to proceed. All questions were answered. )       Anesthesia Quick Evaluation

## 2022-12-02 NOTE — Anesthesia Procedure Notes (Signed)
Epidural Patient location during procedure: OB Start time: 12/02/2022 6:45 PM End time: 12/02/2022 6:55 PM  Staffing Anesthesiologist: Elmer Picker, MD Performed: anesthesiologist   Preanesthetic Checklist Completed: patient identified, IV checked, risks and benefits discussed, monitors and equipment checked, pre-op evaluation and timeout performed  Epidural Patient position: sitting Prep: DuraPrep and site prepped and draped Patient monitoring: continuous pulse ox, blood pressure, heart rate and cardiac monitor Approach: midline Location: L3-L4 Injection technique: LOR air  Needle:  Needle type: Tuohy  Needle gauge: 17 G Needle length: 9 cm Needle insertion depth: 6 cm Catheter type: closed end flexible Catheter size: 19 Gauge Catheter at skin depth: 11 cm Test dose: negative  Assessment Sensory level: T8 Events: blood not aspirated, no cerebrospinal fluid, injection not painful, no injection resistance, no paresthesia and negative IV test  Additional Notes Patient identified. Risks/Benefits/Options discussed with patient including but not limited to bleeding, infection, nerve damage, paralysis, failed block, incomplete pain control, headache, blood pressure changes, nausea, vomiting, reactions to medication both or allergic, itching and postpartum back pain. Confirmed with bedside nurse the patient's most recent platelet count. Confirmed with patient that they are not currently taking any anticoagulation, have any bleeding history or any family history of bleeding disorders. Patient expressed understanding and wished to proceed. All questions were answered. Sterile technique was used throughout the entire procedure. Please see nursing notes for vital signs. Test dose was given through epidural catheter and negative prior to continuing to dose epidural or start infusion. Warning signs of high block given to the patient including shortness of breath, tingling/numbness in  hands, complete motor block, or any concerning symptoms with instructions to call for help. Patient was given instructions on fall risk and not to get out of bed. All questions and concerns addressed with instructions to call with any issues or inadequate analgesia.  Reason for block:procedure for pain

## 2022-12-02 NOTE — Progress Notes (Signed)
OB PN:  S: Pt resting comfortably, no acute complaints  O: BP 121/71   Pulse 73   Temp 97.9 F (36.6 C) (Oral)   Resp 16   Ht 5' (1.524 m)   Wt 81.6 kg   LMP 03/01/2022 (Exact Date)   BMI 35.11 kg/m   FHT: 135bpm, moderate variablity, + accels, no decels Toco: q2-9min SVE: deferred, per RN: 3/70/-2  Results for orders placed or performed during the hospital encounter of 12/02/22 (from the past 24 hour(s))  CBC     Status: None   Collection Time: 12/02/22  7:24 AM  Result Value Ref Range   WBC 8.6 4.0 - 10.5 K/uL   RBC 4.04 3.87 - 5.11 MIL/uL   Hemoglobin 12.2 12.0 - 15.0 g/dL   HCT 66.4 40.3 - 47.4 %   MCV 89.9 80.0 - 100.0 fL   MCH 30.2 26.0 - 34.0 pg   MCHC 33.6 30.0 - 36.0 g/dL   RDW 25.9 56.3 - 87.5 %   Platelets 258 150 - 400 K/uL   nRBC 0.0 0.0 - 0.2 %  Type and screen     Status: None   Collection Time: 12/02/22  7:24 AM  Result Value Ref Range   ABO/RH(D) B POS    Antibody Screen NEG    Sample Expiration      12/05/2022,2359 Performed at [facility], [address] [place], [place] [address]   RPR     Status: None   Collection Time: 12/02/22  7:24 AM  Result Value Ref Range   RPR Ser Ql NON REACTIVE NON REACTIVE  Glucose, capillary     Status: None   Collection Time: 12/02/22  7:46 AM  Result Value Ref Range   Glucose-Capillary 88 70 - 99 mg/dL  Glucose, capillary     Status: None   Collection Time: 12/02/22 11:46 AM  Result Value Ref Range   Glucose-Capillary 74 70 - 99 mg/dL     A/P: 27 y.o. R5J8 @ [redacted]w[redacted]d for IOL due to uncontrolled DM  1. FWB: Cat. I 2. Labor: continue Pit per protocol Pain: IV or epidural upon request GBS: positive- PCN per protocol GDMA1- accucheck q 4hr  Myna Hidalgo, DO Attending Obstetrician & Gynecologist, [facility] Center for [employer], [facility]

## 2022-12-02 NOTE — Progress Notes (Signed)
Patient ID: Susan Davis, female   DOB: 09/19/1995, 27 y.o.   MRN: [id number] Feeling some rectal pressure  Vitals:   12/02/22 1930 12/02/22 2000 12/02/22 2050 12/02/22 2100  BP: (!) 120/95 116/67 126/66 135/75  Pulse: 69 69 74 77  Resp:    17  Temp: 98.2 F (36.8 C)   97.8 F (36.6 C)  TempSrc: Oral   Oral  SpO2:  100%  100%  Weight:      Height:       FHR 135 with average variability + accels Occasional sharp variable decel lasting 30 seconds or less  Last cervix exam 7cm  Anticipate SVD

## 2022-12-02 NOTE — Progress Notes (Signed)
Patient ID: Susan Davis, female   DOB: 25-Mar-1995, 27 y.o.   MRN: [id number] Pushing well to crowning  Vitals:   12/02/22 2000 12/02/22 2050 12/02/22 2100 12/02/22 2129  BP: 116/67 126/66 135/75 134/78  Pulse: 69 74 77 77  Resp:   17   Temp:   97.8 F (36.6 C)   TempSrc:   Oral   SpO2: 100%  100% 100%  Weight:      Height:       FHR stable  Anticipate SVD

## 2022-12-03 ENCOUNTER — Encounter (HOSPITAL_COMMUNITY): Payer: Self-pay | Admitting: Family Medicine

## 2022-12-03 MED ORDER — ONDANSETRON HCL 4 MG/2ML IJ SOLN: 4.0000 mg | INTRAMUSCULAR | Status: DC | PRN

## 2022-12-03 MED ORDER — DIPHENHYDRAMINE HCL 25 MG PO CAPS: 25.0000 mg | ORAL_CAPSULE | Freq: Four times a day (QID) | ORAL | Status: DC | PRN

## 2022-12-03 MED ORDER — SIMETHICONE 80 MG PO CHEW: 80.0000 mg | CHEWABLE_TABLET | ORAL | Status: DC | PRN

## 2022-12-03 MED ORDER — TETANUS-DIPHTH-ACELL PERTUSSIS 5-2.5-18.5 LF-MCG/0.5 IM SUSY: 0.5000 mL | PREFILLED_SYRINGE | Freq: Once | INTRAMUSCULAR | Status: DC

## 2022-12-03 MED ORDER — IBUPROFEN 600 MG PO TABS
600.0000 mg | ORAL_TABLET | Freq: Four times a day (QID) | ORAL | Status: DC
  Administered 2022-12-03 – 2022-12-04 (×5): 600 mg via ORAL
  Filled 2022-12-03 (×6): qty 1

## 2022-12-03 MED ORDER — COCONUT OIL OIL: 1.0000 | TOPICAL_OIL | Status: DC | PRN

## 2022-12-03 MED ORDER — ACETAMINOPHEN 325 MG PO TABS: 650.0000 mg | ORAL_TABLET | ORAL | Status: DC | PRN

## 2022-12-03 MED ORDER — FUROSEMIDE 20 MG PO TABS
20.0000 mg | ORAL_TABLET | Freq: Every day | ORAL | Status: DC
  Administered 2022-12-03 – 2022-12-04 (×2): 20 mg via ORAL
  Filled 2022-12-03 (×2): qty 1

## 2022-12-03 MED ORDER — WITCH HAZEL-GLYCERIN EX PADS: 1.0000 | MEDICATED_PAD | CUTANEOUS | Status: DC | PRN

## 2022-12-03 MED ORDER — ONDANSETRON HCL 4 MG PO TABS: 4.0000 mg | ORAL_TABLET | ORAL | Status: DC | PRN

## 2022-12-03 MED ORDER — DIBUCAINE (PERIANAL) 1 % EX OINT: 1.0000 | TOPICAL_OINTMENT | CUTANEOUS | Status: DC | PRN

## 2022-12-03 MED ORDER — BENZOCAINE-MENTHOL 20-0.5 % EX AERO: 1.0000 | INHALATION_SPRAY | CUTANEOUS | Status: DC | PRN

## 2022-12-03 MED ORDER — ZOLPIDEM TARTRATE 5 MG PO TABS: 5.0000 mg | ORAL_TABLET | Freq: Every evening | ORAL | Status: DC | PRN

## 2022-12-03 MED ORDER — OLANZAPINE 10 MG PO TABS
20.0000 mg | ORAL_TABLET | Freq: Every day | ORAL | Status: DC
  Administered 2022-12-03: 20 mg via ORAL
  Filled 2022-12-03 (×2): qty 2

## 2022-12-03 MED ORDER — PRENATAL MULTIVITAMIN CH
1.0000 | ORAL_TABLET | Freq: Every day | ORAL | Status: DC
  Administered 2022-12-03 – 2022-12-04 (×2): 1 via ORAL
  Filled 2022-12-03 (×2): qty 1

## 2022-12-03 MED ORDER — SENNOSIDES-DOCUSATE SODIUM 8.6-50 MG PO TABS
2.0000 | ORAL_TABLET | ORAL | Status: DC
  Administered 2022-12-03: 2 via ORAL
  Filled 2022-12-03 (×2): qty 2

## 2022-12-03 NOTE — Progress Notes (Signed)
Post Partum Day 1 Subjective: no complaints, up ad lib, voiding, and tolerating PO  Objective: Blood pressure 114/76, pulse 72, temperature 98.3 F (36.8 C), temperature source Oral, resp. rate 18, height 5' (1.524 m), weight 81.6 kg, last menstrual period 03/01/2022, SpO2 98%, unknown if currently breastfeeding.  Physical Exam:  General: alert, cooperative, and no distress Lochia: appropriate Uterine Fundus: firm Incision: n/a DVT Evaluation: No evidence of DVT seen on physical exam.  Recent Labs    12/02/22 0724 12/02/22 1947  HGB 12.2 11.9*  HCT 36.3 35.9*    Assessment/Plan: Plan for discharge tomorrow   LOS: 1 day   Wynelle Bourgeois, CNM 12/03/2022, 7:31 AM

## 2022-12-03 NOTE — Anesthesia Postprocedure Evaluation (Signed)
Anesthesia Post Note  Patient: Susan Davis  Procedure(s) Performed: AN AD HOC LABOR EPIDURAL     Patient location during evaluation: Mother Baby Anesthesia Type: Epidural Level of consciousness: awake, awake and alert and oriented Pain management: satisfactory to patient Vital Signs Assessment: post-procedure vital signs reviewed and stable Respiratory status: spontaneous breathing, nonlabored ventilation and respiratory function stable Cardiovascular status: blood pressure returned to baseline and stable Postop Assessment: no headache, no backache, able to ambulate, adequate PO intake, patient able to bend at knees and no apparent nausea or vomiting Anesthetic complications: no   No notable events documented.  Last Vitals:  Vitals:   12/03/22 1454 12/03/22 2050  BP: 113/74 123/75  Pulse: 66 90  Resp: 20 19  Temp: 36.7 C 37.1 C  SpO2:  99%    Last Pain:  Vitals:   12/03/22 2050  TempSrc: Oral  PainSc:    Pain Goal:                Epidural/Spinal Function Cutaneous sensation: Normal sensation (12/03/22 2106), Patient able to flex knees: Yes (12/03/22 2106), Patient able to lift hips off bed: Yes (12/03/22 2106), Back pain beyond tenderness at insertion site: No (12/03/22 2106), Progressively worsening motor and/or sensory loss: No (12/03/22 2106), Bowel and/or bladder incontinence post epidural: No (12/03/22 2106)  Carolee Channell

## 2022-12-03 NOTE — Discharge Summary (Signed)
Postpartum Discharge Summary  Date of Service updated***     Patient Name: Susan Davis DOB: 12-23-1995 MRN: [id number]  Date of admission: 12/02/2022 Delivery date:12/02/2022 Delivering provider: Aviva Signs Date of discharge: 12/03/2022  Admitting diagnosis: Gestational diabetes [O24.419] Intrauterine pregnancy: [redacted]w[redacted]d     Secondary diagnosis:  Principal Problem:   Gestational diabetes Active Problems:   Transient hypertension of pregnancy in third trimester   Vaginal delivery  Additional problems: 1st degree labial laceration    Discharge diagnosis: Term Pregnancy Delivered and GDM A1                                              Post partum procedures:{Postpartum procedures:23558} Augmentation: AROM, Pitocin, and Cytotec Complications: None  Hospital course: Induction of Labor With Vaginal Delivery   27 y.o. yo G1P0 at [redacted]w[redacted]d was admitted to the hospital 12/02/2022 for induction of labor.  Indication for induction: A1 DM.  Patient had an labor course complicated bynothing Membrane Rupture Time/Date: 4:38 PM,12/02/2022  Delivery Method:Vaginal, Spontaneous Operative Delivery:N/A Episiotomy: None Lacerations:  Labial Details of delivery can be found in separate delivery note.  Patient had a postpartum course complicated by***. Patient is discharged home 12/03/22.  Newborn Data: Birth date:12/02/2022 Birth time:10:46 PM Gender:Female Living status:Living Apgars:8 ,9  Weight:   Magnesium Sulfate received: No BMZ received: No Rhophylac:N/A MMR:No T-DaP:Given prenatally Flu: No RSV Vaccine received: No Transfusion:No Immunizations administered: Immunization History  Administered Date(s) Administered   PFIZER Comirnaty(Gray Top)Covid-19 Tri-Sucrose Vaccine 05/24/2019, 06/10/2019, 01/24/2020   Tdap 02/05/2016, 12/11/2016, 07/26/2017, 09/25/2020    Physical exam  Vitals:   12/02/22 2300 12/02/22 2320 12/02/22 2331 12/02/22 2345  BP: 124/74 (!)  124/90 126/85 108/69  Pulse: 72 74 69 69  Resp:  18  18  Temp:  98.3 F (36.8 C)    TempSrc:  Oral    SpO2: 99%     Weight:      Height:       General: {Exam; general:21111117} Lochia: {Desc; appropriate/inappropriate:30686::"appropriate"} Uterine Fundus: {Desc; firm/soft:30687} Incision: {Exam; incision:21111123} DVT Evaluation: {Exam; dvt:2111122} Labs: Lab Results  Component Value Date   WBC 9.9 12/02/2022   HGB 11.9 (L) 12/02/2022   HCT 35.9 (L) 12/02/2022   MCV 89.3 12/02/2022   PLT 245 12/02/2022      Latest Ref Rng & Units 12/02/2022    7:47 PM  CMP  Glucose 70 - 99 mg/dL 98   BUN 6 - 20 mg/dL <5   Creatinine 4.09 - 1.00 mg/dL 8.11   Sodium 914 - 782 mmol/L 136   Potassium 3.5 - 5.1 mmol/L 3.8   Chloride 98 - 111 mmol/L 108   CO2 22 - 32 mmol/L 20   Calcium 8.9 - 10.3 mg/dL 9.2   Total Protein 6.5 - 8.1 g/dL 6.9   Total Bilirubin <9.5 mg/dL 0.5   Alkaline Phos 38 - 126 U/L 115   AST 15 - 41 U/L 21   ALT 0 - 44 U/L 26    Edinburgh Score:     No data to display            After visit meds:  Allergies as of 12/03/2022       Reactions   Other Other (See Comments)   Allergic to Unknown Anxiety Medication which causes twitching (2021) Regarding entry above- is this "Hydroxyzine"?? (2024)  Hydroxyzine Pamoate Swelling, Anxiety, Other (See Comments)   Shaking made feel "like having a panic attack"     Med Rec must be completed prior to using this [facility]***        Discharge home in stable condition Infant Feeding: Bottle Infant Disposition:{CHL IP OB HOME WITH ZOXWRU:04540} Discharge instruction: per After Visit Summary and Postpartum booklet. Activity: Advance as tolerated. Pelvic rest for 6 weeks.  Diet: {OB diet:21111121} Anticipated Birth Control: {Birth Control:23956} Postpartum Appointment:{Outpatient follow up:23559} Additional Postpartum F/U: {PP Procedure:23957} Future Appointments: Future Appointments  Date Time Provider  Department Center  12/26/2022  2:00 PM Shanna Cisco, NP [facility] None  01/09/2023  1:15 PM Milas Hock, MD [facility]   Follow up Visit:      12/03/2022 Wynelle Bourgeois, CNM

## 2022-12-03 NOTE — Clinical Social Work Maternal (Signed)
CLINICAL SOCIAL WORK MATERNAL/CHILD NOTE  Patient Details  Name: Jekayla Ramsay MRN: [id number] Date of Birth: 03-Oct-1995  Date:  12/03/2022  Clinical Social Worker Initiating Note:  Enos Fling Date/Time: Initiated:  12/03/22/1516     Child's Name:  Kern Alberta   Biological Parents:  Mother, Father Charlott Pflanz 08-26-1995 Adalberto Cole 28 years old)   Need for Interpreter:  None   Reason for Referral:  Behavioral Health Concerns   Address:  [address] [place] [address]    Phone number:  [phone] (home)     Additional phone number:   Household Members/Support Persons (HM/SP):   Household Member/Support Person 1   HM/SP Name Relationship DOB or Age  HM/SP -1   MOB's mom    HM/SP -2        HM/SP -3        HM/SP -4        HM/SP -5        HM/SP -6        HM/SP -7        HM/SP -8          Natural Supports (not living in the home):  Immediate Family, Spouse/significant other   Professional Supports: Therapist   Employment: Full-time   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  Psychotropic Medications:         Pediatrician:       Pediatrician List: MOB was give list by support staff  [employer]    [place]      Pediatrician Fax Number:    Risk Factors/Current Problems:  Mental Health Concerns     Cognitive State:  Able to Concentrate  , Alert  , Linear Thinking  , Insightful  , Goal Oriented     Mood/Affect:  Interested  , Comfortable  , Calm  , Relaxed     CSW Assessment: CSW  received a consult for MOB residing at [facility], Bipolar and GAD. CSW met MOB at bedside to offer support and to complete a full psychosocial assessment. CSW entered the room, introduced herself and acknowledged that she had family present. CSW asked MOB for privacy could the guest step out for the assessment; MOB was agreeable and the guest stepped out. CSW explained her role and the reason for the visit. MOB presented bonding/holding the infant and was polite, easy to engage, receptive to meeting with CSW, and appeared forthcoming.  CSW collected MOB's demographic information; and she reported that she has [facility] from [facility] and will be residing with her mom at [address] [place] [address].  CSW inquired about MOB's mental health history. MOB reported being diagnosed with Bipolar in 2023. MOB reported being hospitalized in 2020 and 2024 both stays were at [facility] and found the support helpful. MOB reported [facility] and will continue to during this PP period if needed. MOB reported  her last episode was March of 2024 and the episode was maniac and depressive and was hospitalized for support. MOB reported being prescribed  Zyprexa 20mg  for support and plans to continue the medication during her PP period. CSW provided education regarding the baby blues period vs. perinatal mood disorders, discussed treatment and gave resources for mental health follow up if concerns arise.  CSW recommends self-evaluation during the postpartum time period using the New Mom Checklist from Postpartum Progress and encouraged MOB to contact a medical professional if symptoms are noted at any time. CSW assessed for safety with MOB SI/HI/DV;MOB denied all.  CSW asked MOB does she receive support resources; MOB said yes(WIC and food stamps). MOB reported having all essential items for the infant including a carseat, bassinet and crib for safe  sleeping. CSW provided review of Sudden Infant Death Syndrome (SIDS) precautions.  CSW Plan/Description:  CSW identifies no further need for intervention and no barriers to discharge at this time.     Barnetta Chapel, LCSW 12/03/2022, 3:19 PM

## 2022-12-03 NOTE — Discharge Summary (Incomplete)
Postpartum Discharge Summary  Date of Service updated***     Patient Name: Susan Davis DOB: Mar 17, 1995 MRN: [id number]  Date of admission: 12/02/2022 Delivery date:12/02/2022 Delivering provider: Aviva Signs Date of discharge: 12/03/2022  Admitting diagnosis: Gestational diabetes [O24.419] Intrauterine pregnancy: [redacted]w[redacted]d     Secondary diagnosis:  Principal Problem:   Gestational diabetes Active Problems:   Transient hypertension of pregnancy in third trimester   Vaginal delivery  Additional problems: 1st degree labial laceration    Discharge diagnosis: Term Pregnancy Delivered and GDM A1                                              Post partum procedures:{Postpartum procedures:23558} Augmentation: AROM, Pitocin, and Cytotec Complications: None  Hospital course: Induction of Labor With Vaginal Delivery   27 y.o. yo G1P0 at [redacted]w[redacted]d was admitted to the hospital 12/02/2022 for induction of labor.  Indication for induction: A1 DM.  Patient had an labor course complicated bynothing Membrane Rupture Time/Date: 4:38 PM,12/02/2022  Delivery Method:Vaginal, Spontaneous Operative Delivery:N/A Episiotomy: None Lacerations:  Labial Details of delivery can be found in separate delivery note.  Patient had a postpartum course complicated by***. Patient is discharged home 12/03/22.  Newborn Data: Birth date:12/02/2022 Birth time:10:46 PM Gender:Female Living status:Living Apgars:8 ,9  Weight:   Magnesium Sulfate received: No BMZ received: No Rhophylac:N/A MMR:No T-DaP:Given prenatally Flu: No RSV Vaccine received: {RSV:31013} Transfusion:{Transfusion received:30440034} Immunizations administered: Immunization History  Administered Date(s) Administered  . PFIZER Comirnaty(Gray Top)Covid-19 Tri-Sucrose Vaccine 05/24/2019, 06/10/2019, 01/24/2020  . Tdap 02/05/2016, 12/11/2016, 07/26/2017, 09/25/2020    Physical exam  Vitals:   12/02/22 2300 12/02/22 2320  12/02/22 2331 12/02/22 2345  BP: 124/74 (!) 124/90 126/85 108/69  Pulse: 72 74 69 69  Resp:  18  18  Temp:  98.3 F (36.8 C)    TempSrc:  Oral    SpO2: 99%     Weight:      Height:       General: {Exam; general:21111117} Lochia: {Desc; appropriate/inappropriate:30686::"appropriate"} Uterine Fundus: {Desc; firm/soft:30687} Incision: {Exam; incision:21111123} DVT Evaluation: {Exam; dvt:2111122} Labs: Lab Results  Component Value Date   WBC 9.9 12/02/2022   HGB 11.9 (L) 12/02/2022   HCT 35.9 (L) 12/02/2022   MCV 89.3 12/02/2022   PLT 245 12/02/2022      Latest Ref Rng & Units 12/02/2022    7:47 PM  CMP  Glucose 70 - 99 mg/dL 98   BUN 6 - 20 mg/dL <5   Creatinine 7.84 - 1.00 mg/dL 6.96   Sodium 295 - 284 mmol/L 136   Potassium 3.5 - 5.1 mmol/L 3.8   Chloride 98 - 111 mmol/L 108   CO2 22 - 32 mmol/L 20   Calcium 8.9 - 10.3 mg/dL 9.2   Total Protein 6.5 - 8.1 g/dL 6.9   Total Bilirubin <1.3 mg/dL 0.5   Alkaline Phos 38 - 126 U/L 115   AST 15 - 41 U/L 21   ALT 0 - 44 U/L 26    Edinburgh Score:     No data to display            After visit meds:  Allergies as of 12/03/2022       Reactions   Other Other (See Comments)   Allergic to Unknown Anxiety Medication which causes twitching (2021) Regarding entry above- is this "Hydroxyzine"?? (2024)  Hydroxyzine Pamoate Swelling, Anxiety, Other (See Comments)   Shaking made feel "like having a panic attack"     Med Rec must be completed prior to using this [facility]***        Discharge home in stable condition Infant Feeding: {Baby feeding:23562} Infant Disposition:{CHL IP OB HOME WITH ZOXWRU:04540} Discharge instruction: per After Visit Summary and Postpartum booklet. Activity: Advance as tolerated. Pelvic rest for 6 weeks.  Diet: {OB diet:21111121} Anticipated Birth Control: {Birth Control:23956} Postpartum Appointment:{Outpatient follow up:23559} Additional Postpartum F/U: {PP Procedure:23957} Future  Appointments: Future Appointments  Date Time Provider Department Center  12/26/2022  2:00 PM Shanna Cisco, NP [facility] None  01/09/2023  1:15 PM Milas Hock, MD [facility]   Follow up Visit:      12/03/2022 Wynelle Bourgeois, CNM

## 2022-12-04 ENCOUNTER — Other Ambulatory Visit (HOSPITAL_COMMUNITY): Payer: Self-pay

## 2022-12-04 MED ORDER — IBUPROFEN 600 MG PO TABS
600.0000 mg | ORAL_TABLET | Freq: Four times a day (QID) | ORAL | 0 refills | Status: DC
  Filled 2022-12-04: qty 30, 8d supply, fill #0

## 2022-12-04 MED ORDER — FUROSEMIDE 20 MG PO TABS
20.0000 mg | ORAL_TABLET | Freq: Every day | ORAL | 0 refills | Status: DC
  Filled 2022-12-04: qty 6, 6d supply, fill #0

## 2022-12-04 MED ORDER — SENNOSIDES-DOCUSATE SODIUM 8.6-50 MG PO TABS
2.0000 | ORAL_TABLET | ORAL | 0 refills | Status: DC
  Filled 2022-12-04: qty 60, 30d supply, fill #0

## 2022-12-09 ENCOUNTER — Other Ambulatory Visit: Payer: Self-pay

## 2022-12-09 MED ORDER — ACCU-CHEK SOFTCLIX LANCETS MISC: 12 refills | Status: DC

## 2022-12-09 MED ORDER — FLUCONAZOLE 150 MG PO TABS: 150.0000 mg | ORAL_TABLET | ORAL | 0 refills | Status: DC

## 2022-12-09 MED ORDER — ACCU-CHEK GUIDE TEST VI STRP: ORAL_STRIP | Freq: Four times a day (QID) | 12 refills | Status: DC

## 2022-12-09 MED ORDER — SALINE NASAL SPRAY 0.65 % NA SOLN: 1.0000 | Freq: Two times a day (BID) | NASAL | 1 refills | Status: DC | PRN

## 2022-12-10 ENCOUNTER — Other Ambulatory Visit: Payer: Self-pay

## 2022-12-11 ENCOUNTER — Telehealth (HOSPITAL_COMMUNITY): Payer: Self-pay | Admitting: *Deleted

## 2022-12-11 NOTE — Telephone Encounter (Signed)
12/11/2022  Name: Ifunanya Fiero MRN: [id number] DOB: [birth year]  Reason for Call:  Transition of Care Hospital Discharge Call  Contact Status: Patient Contact Status: Complete  Language assistant needed: Interpreter Mode: Interpreter Not Needed        Follow-Up Questions: Do You Have Any Concerns About Your Health As You Heal From Delivery?: No Do You Have Any Concerns About Your Infants Health?: No  Edinburgh Postnatal Depression Scale:  In the Past 7 Days: I have been able to laugh and see the funny side of things.: As much as I always could I have looked forward with enjoyment to things.: As much as I ever did I have blamed myself unnecessarily when things went wrong.: Not very often I have been anxious or worried for no good reason.: Yes, sometimes I have felt scared or panicky for no good reason.: Yes, sometimes Things have been getting on top of me.: No, I have been coping as well as ever I have been so unhappy that I have had difficulty sleeping.: Not at all I have felt sad or miserable.: No, not at all I have been so unhappy that I have been crying.: No, never The thought of harming myself has occurred to me.: Never Edinburgh Postnatal Depression Scale Total: 5  PHQ2-9 Depression Scale:     Discharge Follow-up: Edinburgh score requires follow up?: No Patient was advised of the following resources:: Support Group, Breastfeeding Support Group  Post-discharge interventions: Reviewed Newborn Safe Sleep Practices  Salena Saner, RN 12/11/2022  14:44

## 2022-12-26 ENCOUNTER — Telehealth (HOSPITAL_COMMUNITY): Payer: MEDICAID | Admitting: Psychiatry

## 2022-12-26 ENCOUNTER — Encounter (HOSPITAL_COMMUNITY): Payer: Self-pay | Admitting: Psychiatry

## 2022-12-26 ENCOUNTER — Other Ambulatory Visit: Payer: Self-pay

## 2022-12-26 DIAGNOSIS — F411 Generalized anxiety disorder: Secondary | ICD-10-CM

## 2022-12-26 DIAGNOSIS — F3173 Bipolar disorder, in partial remission, most recent episode manic: Secondary | ICD-10-CM

## 2022-12-26 MED ORDER — BUSPIRONE HCL 10 MG PO TABS
10.0000 mg | ORAL_TABLET | Freq: Three times a day (TID) | ORAL | 3 refills | Status: DC
  Filled 2022-12-26: qty 90, 30d supply, fill #0

## 2022-12-26 MED ORDER — OLANZAPINE 20 MG PO TABS
20.0000 mg | ORAL_TABLET | Freq: Every day | ORAL | 3 refills | Status: DC
  Filled 2022-12-26 – 2023-01-20 (×2): qty 30, 30d supply, fill #0
  Filled 2023-02-24: qty 30, 30d supply, fill #1

## 2022-12-26 NOTE — Progress Notes (Signed)
BH MD/PA/NP OP Progress Note. Virtual Visit via Video Note  I connected with Natalyn Doris Cheadle on 12/26/22 at  2:00 PM EST by a video enabled telemedicine application and verified that I am speaking with the correct person using two identifiers.  Location: Patient: Car Provider: Clinic   I discussed the limitations of evaluation and management by telemedicine and the availability of in person appointments. The patient expressed understanding and agreed to proceed.  I provided 30 minutes of non-face-to-face time during this encounter.          12/26/2022 3:53 PM Sharmila Doris Cheadle  MRN:  [id number]  Chief Complaint: "I would like something for anxiety and depression"   HPI: 27 year old female seen today for follow up psychiatric evaluation.   She has a psychiatric history of bipolar disorder, anxiety, and depression.  Patients currently managed on Zyprexa  20 mg nightly. She notes that her medication is somewhat effective in managing her psychiatric conditions.   Today she was well groomed,  pleasant, cooperative, and engaged in conversation.  She informed Clinical research associate that she would like something help manage her anxiety and depression.  Patient informed Clinical research associate that since having her 9 month-old son she has been fatigued, anxious, and getting 6 hours of sleep.  Patient does note that following her child and her mother is supportive.  Today provider conducted a GAD-7 and patient scored a 6, at her last visit she scored an 8.  Provider also conducted PHQ-9 patient 5, at her last visit to scored a 7.  Today she denies SI/HI/VAH, mania, paranoia.   Provider discussed hydroxyzine however patient notes that she may be allergic to it.  Today she will start BuSpar 10 mg 3 times daily to help manage anxiety and depression.  She will continue Zyprexa as prescribed. Potential side effects of medication and risks vs benefits of treatment vs non-treatment were explained and discussed. All questions  were answered.No other concerns noted at this time.   Visit Diagnosis:    ICD-10-CM   1. GAD (generalized anxiety disorder)  F41.1 busPIRone (BUSPAR) 10 MG tablet    2. Bipolar disorder, in partial remission, most recent episode manic (HCC)  F31.73 OLANZapine (ZYPREXA) 20 MG tablet          Past Psychiatric History: Depression, Anxiety, and Bipolar affective Past Medical History:  Past Medical History:  Diagnosis Date   Anxiety    Bipolar 1 disorder (HCC)    Depression    Encounter for supervision of normal first pregnancy in first trimester 05/24/2022              NURSING     PROVIDER      Office Location    [facility]    Dating by    LMP c/w U/S at 9 wks      [facility] Model    Traditional    Anatomy U/S           Initiated care at     [employer]     English                     LAB RESULTS       Support Person    FOB- Penni Bombard and Mom     Genetics    NIPS:       low risk female  ZDG:[id number]                      NT/IT (FT only)      Gestational diabetes    Medical history non-contributory     Past Surgical History:  Procedure Laterality Date   NO PAST SURGERIES      Family Psychiatric History: Patient notes that several people in her family suffer from anxiety and depression however she could not give provider specifics of who they were.    Family History:  Family History  Problem Relation Age of Onset   Hypertension Mother    Diabetes Mother     Social History:  Social History   Socioeconomic History   Marital status: Single    Spouse name: Not on file   Number of children: Not on file   Years of education: Not on file   Highest education level: Not on file  Occupational History   Not on file  Tobacco Use   Smoking status: Never   Smokeless tobacco: Never  Vaping Use   Vaping status: Never Used  Substance and Sexual Activity   Alcohol use: Not Currently    Comment: "occasionally", not since confirmed pregnancy   Drug use: Not Currently     Types: Marijuana    Comment: pt states its been 1-2 months, not since 04/14/22   Sexual activity: Yes    Partners: Male    Birth control/protection: None  Other Topics Concern   Not on file  Social History Narrative   Not on file   Social Drivers of Health   Financial Resource Strain: Not on file  Food Insecurity: No Food Insecurity (12/02/2022)   Hunger Vital Sign    Worried About Running Out of Food in the Last Year: Never true    Ran Out of Food in the Last Year: Never true  Transportation Needs: No Transportation Needs (12/02/2022)   PRAPARE - Administrator, Civil Service (Medical): No    Lack of Transportation (Non-Medical): No  Physical Activity: Not on file  Stress: Not on file  Social Connections: Not on file    Allergies:  Allergies  Allergen Reactions   Other Other (See Comments)    Allergic to Unknown Anxiety Medication which causes twitching (2021)  Regarding entry above- is this "Hydroxyzine"?? (2024)   Hydroxyzine Pamoate Swelling, Anxiety and Other (See Comments)    Shaking made feel "like having a panic attack"    Metabolic Disorder Labs: Lab Results  Component Value Date   HGBA1C 5.5 04/04/2022   MPG 111 04/04/2022   Lab Results  Component Value Date   PROLACTIN 23.0 09/24/2021   PROLACTIN 52.1 (H) 08/14/2020   Lab Results  Component Value Date   CHOL 125 04/04/2022   TRIG 72 04/04/2022   HDL 58 04/04/2022   CHOLHDL 2.2 04/04/2022   VLDL 14 04/04/2022   LDLCALC 53 04/04/2022   LDLCALC 81 05/19/2018   Lab Results  Component Value Date   TSH 1.010 04/04/2022   TSH 1.430 09/24/2021    Therapeutic Level Labs: No results found for: "LITHIUM" Lab Results  Component Value Date   VALPROATE <10 (L) 04/05/2022   VALPROATE 29 (L) 09/24/2021   No results found for: "CBMZ"  Current Medications: Current Outpatient Medications  Medication Sig Dispense Refill   busPIRone (BUSPAR) 10 MG tablet Take 1 tablet (10 mg total) by mouth  3 (three) times daily. 90 tablet 3   Accu-Chek Softclix Lancets  lancets Use as instructed four time daily. 100 each 12   fluconazole (DIFLUCAN) 150 MG tablet Take 1 tablet (150 mg total) by mouth once for 1 dose. Can take additional dose three days later if symptoms persist. 1 tablet 0   furosemide (LASIX) 20 MG tablet Take 1 tablet (20 mg total) by mouth daily. 6 tablet 0   glucose blood (ACCU-CHEK GUIDE TEST) test strip Use as instructed 4 (four) times daily. 100 each 12   ibuprofen (ADVIL) 600 MG tablet Take 1 tablet (600 mg total) by mouth every 6 (six) hours. 30 tablet 0   OLANZapine (ZYPREXA) 20 MG tablet Take 1 tablet (20 mg total) by mouth at bedtime. 30 tablet 3   Prenatal 27-1 MG TABS Take 1 tablet by mouth daily. 30 tablet 12   senna-docusate (SENOKOT-S) 8.6-50 MG tablet Take 2 tablets by mouth daily. 60 tablet 0   sodium chloride (OCEAN NASAL SPRAY) 0.65 % nasal spray Place 1 spray into the nose 2 (two) times daily as needed for congestion (and dry nose). 44 mL 1   No current facility-administered medications for this visit.     Musculoskeletal: Strength & Muscle Tone: within normal limits and Telehealth visit Gait & Station: normal, Telehealth visit Patient leans: N/A  Psychiatric Specialty Exam: Review of Systems  Last menstrual period 03/01/2022, unknown if currently breastfeeding.There is no height or weight on file to calculate BMI.  General Appearance: Well Groomed  Eye Contact:  Good  Speech:  Clear and Coherent and Normal Rate  Volume:  Normal  Mood:   Mild anxiety and depression occasionally ,   Affect:  Appropriate and Congruent  Thought Process:  Coherent, Goal Directed and Linear  Orientation:  Full (Time, Place, and Person)  Thought Content: WDL and Logical   Suicidal Thoughts:  No  Homicidal Thoughts:  No  Memory:  Immediate;   Good Recent;   Good Remote;   Good  Judgement:  Good  Insight:  Good  Psychomotor Activity:  Normal  Concentration:   Concentration: Good and Attention Span: Good  Recall:  Good  Fund of Knowledge: Good  Language: Good  Akathisia:  No  Handed:  Right  AIMS (if indicated): Not done  Assets:  Communication Skills Desire for Improvement Financial Resources/Insurance Housing Social Support  ADL's:  Intact  Cognition: WNL  Sleep:  Good   Screenings: AIMS    Flowsheet Row [facility]  AIMS Total Score 0 0      AUDIT    Flowsheet Row [facility]) [facility] ([facility]  Alcohol Use Disorder Identification Test Final Score (AUDIT) 6 0      GAD-7    Flowsheet Row Video Visit from 12/26/2022 in [facility] Video Visit from 11/20/2022 in [facility] Video Visit from 09/05/2022 in [facility] Initial Prenatal from 06/24/2022 in [facility] Video Visit from 06/12/2022 in [facility]  Total GAD-7 Score 6 8 4 2  0      PHQ2-9    Flowsheet Row Video Visit from 12/26/2022 in [facility] Video Visit from 11/20/2022 in [facility] Visit from 09/[facility]/2022 [facility] Initial Prenatal from 06/24/2022 [facility] Video Visit from 06/12/2022 in [facility]  PHQ-2 Total Score 1 4 2 1  0  PHQ-9 Total Score 5 7 4 2 1       Flowsheet Row Video Visit from 12/26/2022 in [facility] Admission (Discharged) from 12/02/2022 in [place] [facility] Video Visit from  11/20/2022 in [facility]  C-SSRS RISK CATEGORY No Risk No Risk No Risk        Assessment and Plan: Patient reports she has been more anxious, depressed, and having poor sleep since the birth of her 84-month-old son.  Provider discussed hydroxyzine however patient notes that she may be allergic to it.  Today she will start BuSpar 10 mg 3 times daily to help manage anxiety and depression.  She will continue Zyprexa as prescribed.  1. Bipolar disorder, in partial remission, most recent episode manic (HCC)  Continue- OLANZapine (ZYPREXA) 20 MG tablet; Take 1 tablet (20 mg total) by mouth at bedtime.  Dispense: 30 tablet; Refill: 3  2. GAD (generalized anxiety disorder) (Primary)  Start- busPIRone (BUSPAR) 10 MG tablet; Take 1 tablet (10 mg total) by mouth 3 (three) times daily.  Dispense: 90 tablet; Refill: 3  Follow up in 5 weeks  Shanna Cisco, NP 12/26/2022, 3:53 PM

## 2022-12-27 ENCOUNTER — Other Ambulatory Visit: Payer: Self-pay

## 2023-01-09 ENCOUNTER — Other Ambulatory Visit: Payer: Self-pay

## 2023-01-09 ENCOUNTER — Ambulatory Visit: Payer: MEDICAID | Admitting: Obstetrics and Gynecology

## 2023-01-09 DIAGNOSIS — Z1331 Encounter for screening for depression: Secondary | ICD-10-CM

## 2023-01-09 DIAGNOSIS — O24410 Gestational diabetes mellitus in pregnancy, diet controlled: Secondary | ICD-10-CM

## 2023-01-09 DIAGNOSIS — Z8632 Personal history of gestational diabetes: Secondary | ICD-10-CM

## 2023-01-09 DIAGNOSIS — Z3202 Encounter for pregnancy test, result negative: Secondary | ICD-10-CM | POA: Diagnosis not present

## 2023-01-09 LAB — POCT PREGNANCY, URINE: Preg Test, Ur: NEGATIVE

## 2023-01-09 MED ORDER — IBUPROFEN 600 MG PO TABS
600.0000 mg | ORAL_TABLET | Freq: Four times a day (QID) | ORAL | 0 refills | Status: DC
  Filled 2023-01-09: qty 30, 8d supply, fill #0

## 2023-01-09 MED ORDER — JUNEL FE 24 1-20 MG-MCG(24) PO TABS
1.0000 | ORAL_TABLET | Freq: Every day | ORAL | 3 refills | Status: AC
  Filled 2023-01-09: qty 84, 84d supply, fill #0

## 2023-01-09 NOTE — Progress Notes (Signed)
    Post Partum Visit Note  Susan Davis is a 27 y.o. G37P1001 female who presents for a postpartum visit. She is 6 weeks postpartum following a normal spontaneous vaginal delivery.  I have fully reviewed the prenatal and intrapartum course. The delivery was at 39 gestational weeks.  Anesthesia: epidural. Postpartum course has been uncomplicated. Baby is doing well. Baby is feeding by bottle - similac soy . Bleeding staining only. Bowel function is normal. Bladder function is normal. Patient is not sexually active. Contraception method is none. Postpartum depression screening: negative.   The pregnancy intention screening data noted above was reviewed. Potential methods of contraception were discussed. The patient elected to proceed with No data recorded.    Health Maintenance Due  Topic Date Due   Hepatitis C Screening  Never done   INFLUENZA VACCINE  Never done   COVID-19 Vaccine (4 - 2024-25 season) 09/15/2022    The following portions of the patient's history were reviewed and updated as appropriate: allergies, current medications, past family history, past medical history, past social history, past surgical history, and problem list.  Review of Systems Pertinent items are noted in HPI.  Objective:  LMP 03/01/2022 (Exact Date)    General:  alert, cooperative, and no distress   Breasts:  not indicated  Lungs: Normal effort  Heart:  Regular rate  Abdomen: soft, non-tender; bowel sounds normal; no masses,  no organomegaly   Wound NA  GU exam:  not indicated       Assessment:   1. Diet controlled gestational diabetes mellitus (GDM) in third trimester Will schedule 2 hr  2. 6 week postpartum exam.   Plan:   Essential components of care per ACOG recommendations:  1.  Mood and well being: Patient with negative depression screening today. Reviewed local resources for support.  - Patient tobacco use? No.   - hx of drug use? No.    2. Infant care and feeding:   -Patient currently breastmilk feeding? No.  -Social determinants of health (SDOH) reviewed in EPIC. No concerns  3. Sexuality, contraception and birth spacing - Patient does not want a pregnancy in the next year.  Desired family size is 3 children.  - Reviewed reproductive life planning. Reviewed contraceptive methods based on pt preferences and effectiveness.  Patient desired Oral Contraceptive today.   - Discussed birth spacing of 18 months  4. Sleep and fatigue -Encouraged family/partner/community support of 4 hrs of uninterrupted sleep to help with mood and fatigue  5. Physical Recovery  - Discussed patients delivery and complications. She describes her labor as good. - Patient had a Vaginal, no problems at delivery. Patient had a  labial  laceration. Perineal healing reviewed. Patient expressed understanding - Patient has urinary incontinence? No. - Patient is safe to resume physical - Hold on sexual activity until soreness improves  6.  Health Maintenance - HM due items addressed Yes - she did not get MMR vaccine in hospital. Reviewed indication for it. Will give info in AVS. She will consider.  - Last pap smear  Diagnosis  Date Value Ref Range Status  06/24/2022   Final   - Negative for intraepithelial lesion or malignancy (NILM)   Pap smear not done at today's visit.  -Breast Cancer screening indicated? No.   7. Chronic Disease/Pregnancy Condition follow up: Gestational Diabetes - PCP follow up  Milas Hock, MD Center for [facility], [facility]

## 2023-01-09 NOTE — Patient Instructions (Signed)
  Silicone based lubricant   

## 2023-01-09 NOTE — Addendum Note (Signed)
Addended by: Milas Hock A on: 01/09/2023 02:23 PM   Modules accepted: Orders

## 2023-01-13 ENCOUNTER — Other Ambulatory Visit: Payer: Self-pay

## 2023-01-13 ENCOUNTER — Other Ambulatory Visit: Payer: MEDICAID

## 2023-01-13 DIAGNOSIS — O24410 Gestational diabetes mellitus in pregnancy, diet controlled: Secondary | ICD-10-CM

## 2023-01-20 ENCOUNTER — Other Ambulatory Visit: Payer: Self-pay

## 2023-02-18 ENCOUNTER — Telehealth (HOSPITAL_COMMUNITY): Payer: Self-pay | Admitting: *Deleted

## 2023-02-18 NOTE — Telephone Encounter (Signed)
Pharmacy faxed PA for Olanzapine 20mg . Submitted online with cover my meds. Awaiting decision.

## 2023-02-24 ENCOUNTER — Other Ambulatory Visit: Payer: Self-pay

## 2023-02-26 ENCOUNTER — Telehealth (INDEPENDENT_AMBULATORY_CARE_PROVIDER_SITE_OTHER): Payer: MEDICAID | Admitting: Psychiatry

## 2023-02-26 ENCOUNTER — Encounter (HOSPITAL_COMMUNITY): Payer: Self-pay | Admitting: Psychiatry

## 2023-02-26 ENCOUNTER — Other Ambulatory Visit: Payer: Self-pay

## 2023-02-26 DIAGNOSIS — F3173 Bipolar disorder, in partial remission, most recent episode manic: Secondary | ICD-10-CM

## 2023-02-26 MED ORDER — OLANZAPINE 20 MG PO TABS
20.0000 mg | ORAL_TABLET | Freq: Every day | ORAL | 3 refills | Status: DC
  Filled 2023-02-26 – 2023-03-27 (×2): qty 30, 30d supply, fill #0
  Filled 2023-04-28: qty 30, 30d supply, fill #1

## 2023-02-26 NOTE — Progress Notes (Signed)
BH MD/PA/NP OP Progress Note. Virtual Visit via Video Note  I connected with Susan Davis on 02/26/23 at  2:30 PM EST by a video enabled telemedicine application and verified that I am speaking with the correct person using two identifiers.  Location: Patient: Home Provider: Clinic   I discussed the limitations of evaluation and management by telemedicine and the availability of in person appointments. The patient expressed understanding and agreed to proceed.  I provided 30 minutes of non-face-to-face time during this encounter.          02/26/2023 2:40 PM Susan Davis  MRN:  [id number]  Chief Complaint: "I am doing a lot better"   HPI: 28 year old female seen today for follow up psychiatric evaluation.   She has a psychiatric history of bipolar disorder, anxiety, and depression.  Patients currently managed on Zyprexa  20 mg nightly and Buspar 10 mg three times daily. She notes that she did not start Buspar and feels mentally stable.    Today she was well groomed,  pleasant, cooperative, and engaged in conversation.  She informed Clinical research associate that she is doing a lot better.  She notes that she has no longer anxious and is enjoying spending time with her son.  Patient notes that her mother allowed her to stay at home with her child.  She notes that she will not be returning to work anytime soon.  She reports that she has little stressed and notes that her anxiety, depression, and mood are well-managed.  Today provider conducted a GAD-7 and patient scored a 3, at her last visit she scored an 6.  Provider also conducted PHQ-9 patient 2, at her last visit to scored a 5.  Patient endorsed reduced appetite and notes that she has been supplementing with Ensure.  She does report that she is now sleeping 8 hours nightly.  Today she denies SI/HI/VAH, mania, paranoia.   At this time BuSpar discontinued.  Patient will continue Zyprexa as prescribed. No other concerns noted at this  time.   Visit Diagnosis:    ICD-10-CM   1. Bipolar disorder, in partial remission, most recent episode manic (HCC)  F31.73 OLANZapine (ZYPREXA) 20 MG tablet           Past Psychiatric History: Depression, Anxiety, and Bipolar affective Past Medical History:  Past Medical History:  Diagnosis Date   Anxiety    Bipolar 1 disorder (HCC)    Depression    Encounter for supervision of normal first pregnancy in first trimester 05/24/2022              NURSING     PROVIDER      Office Location    [facility]    Dating by    LMP c/w U/S at 9 wks      [facility] Model    Traditional    Anatomy U/S           Initiated care at     [employer]     English                     LAB RESULTS       Support Person    FOB- Penni Bombard and Mom     Genetics    NIPS:       low risk female  ZOX:WRUEAV                      NT/IT (FT only)      Gestational diabetes    Medical history non-contributory     Past Surgical History:  Procedure Laterality Date   NO PAST SURGERIES      Family Psychiatric History: Patient notes that several people in her family suffer from anxiety and depression however she could not give provider specifics of who they were.    Family History:  Family History  Problem Relation Age of Onset   Hypertension Mother    Diabetes Mother     Social History:  Social History   Socioeconomic History   Marital status: Single    Spouse name: Not on file   Number of children: Not on file   Years of education: Not on file   Highest education level: Not on file  Occupational History   Not on file  Tobacco Use   Smoking status: Never   Smokeless tobacco: Never  Vaping Use   Vaping status: Never Used  Substance and Sexual Activity   Alcohol use: Not Currently    Comment: "occasionally", not since confirmed pregnancy   Drug use: Not Currently    Types: Marijuana    Comment: pt states its been 1-2 months, not since 04/14/22   Sexual activity: Yes    Partners: Male     Birth control/protection: None  Other Topics Concern   Not on file  Social History Narrative   Not on file   Social Drivers of Health   Financial Resource Strain: Not on file  Food Insecurity: No Food Insecurity (12/02/2022)   Hunger Vital Sign    Worried About Running Out of Food in the Last Year: Never true    Ran Out of Food in the Last Year: Never true  Transportation Needs: No Transportation Needs (12/02/2022)   PRAPARE - Administrator, Civil Service (Medical): No    Lack of Transportation (Non-Medical): No  Physical Activity: Not on file  Stress: Not on file  Social Connections: Not on file    Allergies:  Allergies  Allergen Reactions   Other Other (See Comments)    Allergic to Unknown Anxiety Medication which causes twitching (2021)  Regarding entry above- is this "Hydroxyzine"?? (2024)   Hydroxyzine Pamoate Swelling, Anxiety and Other (See Comments)    Shaking made feel "like having a panic attack"    Metabolic Disorder Labs: Lab Results  Component Value Date   HGBA1C 5.5 04/04/2022   MPG 111 04/04/2022   Lab Results  Component Value Date   PROLACTIN 23.0 09/24/2021   PROLACTIN 52.1 (H) 08/14/2020   Lab Results  Component Value Date   CHOL 125 04/04/2022   TRIG 72 04/04/2022   HDL 58 04/04/2022   CHOLHDL 2.2 04/04/2022   VLDL 14 04/04/2022   LDLCALC 53 04/04/2022   LDLCALC 81 05/19/2018   Lab Results  Component Value Date   TSH 1.010 04/04/2022   TSH 1.430 09/24/2021    Therapeutic Level Labs: No results found for: "LITHIUM" Lab Results  Component Value Date   VALPROATE <10 (L) 04/05/2022   VALPROATE 29 (L) 09/24/2021   No results found for: "CBMZ"  Current Medications: Current Outpatient Medications  Medication Sig Dispense Refill   ibuprofen (ADVIL) 600 MG tablet Take 1 tablet (600 mg total) by mouth every 6 (six) hours. 30 tablet 0   Norethindrone Acetate-Ethinyl Estrad-FE (JUNEL  FE 24) 1-20 MG-MCG(24) tablet Take 1  tablet by mouth daily. 84 tablet 3   OLANZapine (ZYPREXA) 20 MG tablet Take 1 tablet (20 mg total) by mouth at bedtime. 30 tablet 3   No current facility-administered medications for this visit.     Musculoskeletal: Strength & Muscle Tone: within normal limits and Telehealth visit Gait & Station: normal, Telehealth visit Patient leans: N/A  Psychiatric Specialty Exam: Review of Systems  unknown if currently breastfeeding.There is no height or weight on file to calculate BMI.  General Appearance: Well Groomed  Eye Contact:  Good  Speech:  Clear and Coherent and Normal Rate  Volume:  Normal  Mood:   Mild anxiety and depression occasionally ,   Affect:  Appropriate and Congruent  Thought Process:  Coherent, Goal Directed and Linear  Orientation:  Full (Time, Place, and Person)  Thought Content: WDL and Logical   Suicidal Thoughts:  No  Homicidal Thoughts:  No  Memory:  Immediate;   Good Recent;   Good Remote;   Good  Judgement:  Good  Insight:  Good  Psychomotor Activity:  Normal  Concentration:  Concentration: Good and Attention Span: Good  Recall:  Good  Fund of Knowledge: Good  Language: Good  Akathisia:  No  Handed:  Right  AIMS (if indicated): Not done  Assets:  Communication Skills Desire for Improvement Financial Resources/Insurance Housing Social Support  ADL's:  Intact  Cognition: WNL  Sleep:  Good   Screenings: AIMS    Flowsheet Row [facility]) [facility]  AIMS Total Score 0 0      AUDIT    Flowsheet Row [facility]) [facility]  Alcohol Use Disorder Identification Test Final Score (AUDIT) 6 0      GAD-7    Flowsheet Row Video Visit from 02/26/2023 in  [facility] Postpartum Visit from 01/09/2023 in [facility] Video Visit from 12/26/2022 in [facility] Video Visit from 11/20/2022 in [facility] Video Visit from 09/05/2022 in [facility]  Total GAD-7 Score 3 0 6 8 4       PHQ2-9    Flowsheet Row Video Visit from 02/26/2023 in [facility] Postpartum Visit from 01/09/2023 in [facility] Video Visit from 12/26/2022 in [facility] Video Visit from 11/20/2022 in [facility] Video Visit from 09/05/2022 in [facility]  PHQ-2 Total Score 1 0 1 4 2   PHQ-9 Total Score 2 3 5 7 4       Flowsheet Row Video Visit from 12/26/2022 in [facility] Admission (Discharged) from 12/02/2022 in [facility] Video Visit from 11/20/2022 in [facility]  C-SSRS RISK CATEGORY No Risk No Risk No Risk        Assessment and Plan: Patient reports she has been more anxious, depressed, and mood are well managed. She did not start Buspar. At this time BuSpar discontinued.  Patient will continue Zyprexa as prescribed.  1. Bipolar disorder, in partial remission, most recent episode manic (HCC)  Continue- OLANZapine (ZYPREXA) 20 MG tablet; Take 1  tablet (20 mg total) by mouth at bedtime.  Dispense: 30 tablet; Refill: 3   Follow up in 2.5 months  Shanna Cisco, NP 02/26/2023, 2:40 PM

## 2023-03-19 ENCOUNTER — Telehealth (HOSPITAL_COMMUNITY): Payer: Self-pay | Admitting: *Deleted

## 2023-03-19 NOTE — Telephone Encounter (Signed)
 Checked online for PA of Olanzapine. Approved.

## 2023-03-27 ENCOUNTER — Other Ambulatory Visit: Payer: Self-pay

## 2023-03-28 ENCOUNTER — Other Ambulatory Visit: Payer: Self-pay

## 2023-04-28 ENCOUNTER — Other Ambulatory Visit: Payer: Self-pay

## 2023-05-21 ENCOUNTER — Telehealth (HOSPITAL_COMMUNITY): Payer: MEDICAID | Admitting: Psychiatry

## 2023-05-21 ENCOUNTER — Other Ambulatory Visit: Payer: Self-pay

## 2023-05-21 ENCOUNTER — Encounter (HOSPITAL_COMMUNITY): Payer: Self-pay | Admitting: Psychiatry

## 2023-05-21 DIAGNOSIS — F3173 Bipolar disorder, in partial remission, most recent episode manic: Secondary | ICD-10-CM

## 2023-05-21 MED ORDER — OLANZAPINE 20 MG PO TABS
20.0000 mg | ORAL_TABLET | Freq: Every day | ORAL | 3 refills | Status: DC
  Filled 2023-05-21: qty 30, 30d supply, fill #0
  Filled 2023-06-27: qty 30, 30d supply, fill #1
  Filled 2023-07-30 (×2): qty 30, 30d supply, fill #2

## 2023-05-21 NOTE — Progress Notes (Signed)
 BH MD/PA/NP OP Progress Note. Virtual Visit via Video Note  I connected with Valerie Jacques Mattock on 05/21/23 at  2:30 PM EDT by a video enabled telemedicine application and verified that I am speaking with the correct person using two identifiers.  Location: Patient: Home Provider: Clinic   I discussed the limitations of evaluation and management by telemedicine and the availability of in person appointments. The patient expressed understanding and agreed to proceed.  I provided 30 minutes of non-face-to-face time during this encounter.          05/21/2023 12:12 PM Cartier Kaliegh Arment  MRN:  [id number]  Chief Complaint: "I have been moody"   HPI: 28 year old female seen today for follow up psychiatric evaluation.   She has a psychiatric history of bipolar disorder, anxiety, and depression.  Patients currently managed on Zyprexa   20 mg nightly. She notes that her medications are effective in managing her psychiatric conditions.    Today she was well groomed,  pleasant, cooperative, and engaged in conversation.  She informed Clinical research associate that at times she becomes moody.  She notes that her moodiness/aggression is towards her mother.  She however notes that she is able to cope with this.  She denies having intense irritability towards other people.  Patient denies symptoms of mania.   Since her last visit she informed that she has been enjoying spending time with her 84-month-year-old son.  She notes that he is reaching all of his developmental goals.  She reports that her mother has allowed her to stay at home as a standing mother but notes that she is hopeful that she will return to work soon.   Today provider conducted a GAD-7 and patient scored a 4, at her last visit she scored an 3.  Provider also conducted PHQ-9 patient 1, at her last visit to scored a 2.  Patient reports that her appetite has increased.  She no longer has to supplement with Ensure.  She endorses adequate sleep.  Today she  denies SI/HI/VAH, mania, paranoia.   Patient does not have current labs.  Today provider ordered CBC, CMP, thyroid panel, lipid panel, prolactin level, and HgbA1c.  Patient will continue Zyprexa  as prescribed. No other concerns noted at this time.   Visit Diagnosis:    ICD-10-CM   1. Bipolar disorder, in partial remission, most recent episode manic (HCC)  F31.73 OLANZapine  (ZYPREXA ) 20 MG tablet    CBC w/Diff/Platelet    Comprehensive Metabolic Panel (CMET)    Hepatic function panel    Prolactin    HgB A1c    Thyroid Panel With TSH    Lipid Profile            Past Psychiatric History: Depression, Anxiety, and Bipolar affective Past Medical History:  Past Medical History:  Diagnosis Date   Anxiety    Bipolar 1 disorder (HCC)    Depression    Encounter for supervision of normal first pregnancy in first trimester 05/24/2022              NURSING     PROVIDER      Office Location    [facility]    Dating by    LMP c/w U/S at 9 wks      [facility] Model    Traditional    Anatomy U/S           Initiated care at     [facility]  Language     English                     LAB RESULTS       Support Person    FOB- Musician and Mom     Genetics    NIPS:       low risk female      ZOX:WRUEAV                      NT/IT (FT only)      Gestational diabetes    Medical history non-contributory     Past Surgical History:  Procedure Laterality Date   NO PAST SURGERIES      Family Psychiatric History: Patient notes that several people in her family suffer from anxiety and depression however she could not give provider specifics of who they were.    Family History:  Family History  Problem Relation Age of Onset   Hypertension Mother    Diabetes Mother     Social History:  Social History   Socioeconomic History   Marital status: Single    Spouse name: Not on file   Number of children: Not on file   Years of education: Not on file   Highest education level: Not on file  Occupational  History   Not on file  Tobacco Use   Smoking status: Never   Smokeless tobacco: Never  Vaping Use   Vaping status: Never Used  Substance and Sexual Activity   Alcohol use: Not Currently    Comment: "occasionally", not since confirmed pregnancy   Drug use: Not Currently    Types: Marijuana    Comment: pt states its been 1-2 months, not since 04/14/22   Sexual activity: Yes    Partners: Male    Birth control/protection: None  Other Topics Concern   Not on file  Social History Narrative   Not on file   Social Drivers of Health   Financial Resource Strain: Not on file  Food Insecurity: No Food Insecurity (12/02/2022)   Hunger Vital Sign    Worried About Running Out of Food in the Last Year: Never true    Ran Out of Food in the Last Year: Never true  Transportation Needs: No Transportation Needs (12/02/2022)   PRAPARE - Administrator, Civil Service (Medical): No    Lack of Transportation (Non-Medical): No  Physical Activity: Not on file  Stress: Not on file  Social Connections: Not on file    Allergies:  Allergies  Allergen Reactions   Other Other (See Comments)    Allergic to Unknown Anxiety Medication which causes twitching (2021)  Regarding entry above- is this "Hydroxyzine "?? (2024)   Hydroxyzine  Pamoate Swelling, Anxiety and Other (See Comments)    Shaking made feel "like having a panic attack"    Metabolic Disorder Labs: Lab Results  Component Value Date   HGBA1C 5.5 04/04/2022   MPG 111 04/04/2022   Lab Results  Component Value Date   PROLACTIN 23.0 09/24/2021   PROLACTIN 52.1 (H) 08/14/2020   Lab Results  Component Value Date   CHOL 125 04/04/2022   TRIG 72 04/04/2022   HDL 58 04/04/2022   CHOLHDL 2.2 04/04/2022   VLDL 14 04/04/2022   LDLCALC 53 04/04/2022   LDLCALC 81 05/19/2018   Lab Results  Component Value Date   TSH 1.010 04/04/2022   TSH 1.430 09/24/2021    Therapeutic Level Labs: No results found  for: "LITHIUM" Lab  Results  Component Value Date   VALPROATE <10 (L) 04/05/2022   VALPROATE 29 (L) 09/24/2021   No results found for: "CBMZ"  Current Medications: Current Outpatient Medications  Medication Sig Dispense Refill   ibuprofen  (ADVIL ) 600 MG tablet Take 1 tablet (600 mg total) by mouth every 6 (six) hours. 30 tablet 0   Norethindrone  Acetate-Ethinyl Estrad-FE (JUNEL  FE 24) 1-20 MG-MCG(24) tablet Take 1 tablet by mouth daily. 84 tablet 3   OLANZapine  (ZYPREXA ) 20 MG tablet Take 1 tablet (20 mg total) by mouth at bedtime. 30 tablet 3   No current facility-administered medications for this visit.     Musculoskeletal: Strength & Muscle Tone: within normal limits and Telehealth visit Gait & Station: normal, Telehealth visit Patient leans: N/A  Psychiatric Specialty Exam: Review of Systems  unknown if currently breastfeeding.There is no height or weight on file to calculate BMI.  General Appearance: Well Groomed  Eye Contact:  Good  Speech:  Clear and Coherent and Normal Rate  Volume:  Normal  Mood:  Euthymic,   Affect:  Appropriate and Congruent  Thought Process:  Coherent, Goal Directed and Linear  Orientation:  Full (Time, Place, and Person)  Thought Content: WDL and Logical   Suicidal Thoughts:  No  Homicidal Thoughts:  No  Memory:  Immediate;   Good Recent;   Good Remote;   Good  Judgement:  Good  Insight:  Good  Psychomotor Activity:  Normal  Concentration:  Concentration: Good and Attention Span: Good  Recall:  Good  Fund of Knowledge: Good  Language: Good  Akathisia:  No  Handed:  Right  AIMS (if indicated): Not done  Assets:  Communication Skills Desire for Improvement Financial Resources/Insurance Housing Social Support  ADL's:  Intact  Cognition: WNL  Sleep:  Good   Screenings: AIMS    Flowsheet Row [facility]) [facility]  AIMS Total Score 0 0      AUDIT    Flowsheet Row Ad[facility]) [facility] Ad[facility] ([facility]  Alcohol Use Disorder Identification Test Final Score (AUDIT) 6 0      GAD-7    Flowsheet Row Video Visit from 05/21/2023 in [facility] Video Visit from 02/26/2023 in [facility] Postpartum Visit from 01/09/2023 in [facility] Video Visit from 12/26/2022 in [facility] Video Visit from 11/20/2022 in [facility]  Total GAD-7 Score 4 3 0 6 8      PHQ2-9    Flowsheet Row Video Visit from 05/21/2023 in [facility] Video Visit from 02/26/2023 in [facility] Postpartum Visit from 01/09/2023 in [facility] Video Visit from 12/26/2022 in [facility] Video Visit from 11/20/2022 in [facility]  PHQ-2 Total Score 0 1 0 1 4  PHQ-9 Total Score 1 2 3 5 7       Flowsheet Row Video Visit from 12/26/2022 in [facility] Admission (Discharged) from 12/02/2022 in [facility] Video Visit from 11/20/2022 in [facility]  C-SSRS RISK CATEGORY No Risk No Risk No Risk  Assessment and Plan: Patient reports she has been doing well on her current medication regimen.Patient does not have current labs.  Today provider ordered CBC, CMP, thyroid panel, lipid panel, prolactin level, and HgbA1c.  Patient will continue Zyprexa  as prescribed.    1. Bipolar disorder, in partial remission, most recent episode manic (HCC)  Continue- OLANZapine  (ZYPREXA ) 20 MG tablet; Take 1 tablet (20 mg  total) by mouth at bedtime.  Dispense: 30 tablet; Refill: 3 - CBC w/Diff/Platelet; Future - Comprehensive Metabolic Panel (CMET); Future - Hepatic function panel; Future - Prolactin; Future - HgB A1c; Future - Thyroid Panel With TSH; Future - Lipid Profile; Future  Follow up in 2.5 months  Arlyne Bering, NP 05/21/2023, 12:12 PM

## 2023-05-26 ENCOUNTER — Other Ambulatory Visit: Payer: Self-pay

## 2023-06-02 ENCOUNTER — Other Ambulatory Visit (HOSPITAL_COMMUNITY): Payer: MEDICAID

## 2023-06-11 ENCOUNTER — Other Ambulatory Visit (HOSPITAL_COMMUNITY): Payer: MEDICAID

## 2023-06-18 ENCOUNTER — Other Ambulatory Visit (HOSPITAL_COMMUNITY): Payer: MEDICAID

## 2023-06-18 DIAGNOSIS — F3173 Bipolar disorder, in partial remission, most recent episode manic: Secondary | ICD-10-CM

## 2023-06-18 DIAGNOSIS — Z79899 Other long term (current) drug therapy: Secondary | ICD-10-CM | POA: Diagnosis not present

## 2023-06-18 NOTE — Progress Notes (Cosign Needed)
 Pt presents today for venipuncture which was not tolerated successfully in right arm. Pt will be going to [facility] with Reqs that I gave her.    JNL, CMA

## 2023-06-27 ENCOUNTER — Ambulatory Visit
Admission: EM | Admit: 2023-06-27 | Discharge: 2023-06-27 | Disposition: A | Discharge status: Home / Self Care | Payer: MEDICAID | Attending: Physician Assistant | Admitting: Physician Assistant

## 2023-06-27 ENCOUNTER — Encounter: Payer: Self-pay | Admitting: Emergency Medicine

## 2023-06-27 ENCOUNTER — Other Ambulatory Visit: Payer: Self-pay

## 2023-06-27 DIAGNOSIS — J0140 Acute pansinusitis, unspecified: Secondary | ICD-10-CM | POA: Diagnosis not present

## 2023-06-27 DIAGNOSIS — J3489 Other specified disorders of nose and nasal sinuses: Secondary | ICD-10-CM

## 2023-06-27 MED ORDER — AMOXICILLIN-POT CLAVULANATE 875-125 MG PO TABS
1.0000 | ORAL_TABLET | Freq: Two times a day (BID) | ORAL | 0 refills | Status: AC
Start: 2023-06-27 — End: ?
  Filled 2023-06-27: qty 20, 10d supply, fill #0

## 2023-06-27 MED ORDER — FLUTICASONE PROPIONATE 50 MCG/ACT NA SUSP
1.0000 | Freq: Every day | NASAL | 0 refills | Status: AC
  Filled 2023-06-27: qty 16, 60d supply, fill #0

## 2023-06-27 NOTE — ED Provider Notes (Signed)
 [facility]    CSN: [id number] Arrival date & time: 06/27/23  1114      History   Chief Complaint Chief Complaint  Patient presents with   Dizziness   Nasal Congestion   Headache    HPI Susan Davis is a 29 y.o. female.   Patient presents today with a 2 to 3-week history of URI symptoms including sinus pressure, congestion, occasional cough, headache, dizziness.  She describes the dizziness as a feeling that her head is full of fluid but denies any room spinning sensation, lightheadedness, syncope.  She has been using over-the-counter medication such as Mucinex without improvement of symptoms.  She reports that her son was sick initially but he recovered quickly.  She initially thought she was getting better but then had recurrent and worsening symptoms over the past week.  She denies any fever, chest pain, shortness of breath, nausea, vomiting.  She is confident that she is not pregnant.  She denies any recent antibiotics or steroids.  She does have a history of sinus infections with similar presentation.  She denies any history of lung condition including asthma, COPD, smoking.    Past Medical History:  Diagnosis Date   Anxiety    Bipolar 1 disorder (HCC)    Depression    Encounter for supervision of normal first pregnancy in first trimester 05/24/2022              NURSING     PROVIDER      Office Location    [facility]    Dating by    LMP c/w U/S at 9 wks      [facility] Model    Traditional    Anatomy U/S           Initiated care at     [employer]                           Language     English                     LAB RESULTS       Support Person    FOB- Musician and Mom     Genetics    NIPS:       low risk female      WUJ:[id number]                      NT/IT (FT only)      Gestational diabetes    Medical history non-contributory     Patient Active Problem List   Diagnosis Date Noted   History of gestational diabetes 12/02/2022   Severe manic bipolar 1 disorder with psychotic  behavior (HCC) 04/04/2022   GAD (generalized anxiety disorder) 04/04/2022   PTSD (post-traumatic stress disorder) 04/04/2022   Bipolar disorder, in partial remission, most recent episode manic (HCC)    MDD (major depressive disorder) 05/18/2018    Past Surgical History:  Procedure Laterality Date   NO PAST SURGERIES      OB History     Gravida  1   Para  1   Term  1   Preterm      AB      Living  1      SAB      IAB      Ectopic      Multiple  0   Live Births  1  Home Medications    Prior to Admission medications   Medication Sig Start Date End Date Taking? Authorizing Provider  amoxicillin-clavulanate (AUGMENTIN) 875-125 MG tablet Take 1 tablet by mouth every 12 (twelve) hours. 06/27/23  Yes Lauralei Clouse K, PA-C  fluticasone (FLONASE) 50 MCG/ACT nasal spray Place 1 spray into both nostrils daily. 06/27/23  Yes Harlo Jaso K, PA-C  Norethindrone  Acetate-Ethinyl Estrad-FE (JUNEL  FE 24) 1-20 MG-MCG(24) tablet Take 1 tablet by mouth daily. 01/09/23   Lacey Pian, MD  OLANZapine  (ZYPREXA ) 20 MG tablet Take 1 tablet (20 mg total) by mouth at bedtime. 05/21/23   Arlyne Bering, NP    Family History Family History  Problem Relation Age of Onset   Hypertension Mother    Diabetes Mother     Social History Social History   Tobacco Use   Smoking status: Never    Passive exposure: Never   Smokeless tobacco: Never  Vaping Use   Vaping status: Never Used  Substance Use Topics   Alcohol use: Not Currently    Comment: occasionally, not since confirmed pregnancy   Drug use: Not Currently    Types: Marijuana    Comment: pt states its been 1-2 months, not since 04/14/22     Allergies   Other and Hydroxyzine  pamoate   Review of Systems Review of Systems  Constitutional:  Positive for activity change. Negative for appetite change, fatigue and fever.  HENT:  Positive for congestion, postnasal drip, sinus pressure and sore throat. Negative  for sneezing.   Respiratory:  Positive for cough. Negative for shortness of breath.   Cardiovascular:  Negative for chest pain.  Gastrointestinal:  Negative for abdominal pain, diarrhea, nausea and vomiting.  Neurological:  Positive for dizziness (Feeling of fullness in her head) and headaches. Negative for syncope and light-headedness.     Physical Exam Triage Vital Signs ED Triage Vitals  Encounter Vitals Group     BP 06/27/23 1229 (!) 146/97     Girls Systolic BP Percentile --      Girls Diastolic BP Percentile --      Boys Systolic BP Percentile --      Boys Diastolic BP Percentile --      Pulse Rate 06/27/23 1229 80     Resp 06/27/23 1229 18     Temp 06/27/23 1229 98.4 F (36.9 C)     Temp Source 06/27/23 1229 Oral     SpO2 06/27/23 1229 98 %     Weight 06/27/23 1229 179 lb 14.3 oz (81.6 kg)     Height --      Head Circumference --      Peak Flow --      Pain Score 06/27/23 1226 0     Pain Loc --      Pain Education --      Exclude from Growth Chart --    No data found.  Updated Vital Signs BP (!) 146/97 (BP Location: Left Arm)   Pulse 80   Temp 98.4 F (36.9 C) (Oral)   Resp 18   Wt 179 lb 14.3 oz (81.6 kg)   LMP 05/29/2023   SpO2 98%   Breastfeeding No   BMI 35.13 kg/m   Visual Acuity Right Eye Distance:   Left Eye Distance:   Bilateral Distance:    Right Eye Near:   Left Eye Near:    Bilateral Near:     Physical Exam Vitals reviewed.  Constitutional:      General: She is  awake. She is not in acute distress.    Appearance: Normal appearance. She is well-developed. She is not ill-appearing.     Comments: Very pleasant female appears stated age in no acute distress sitting comfortably in exam room  HENT:     Head: Normocephalic and atraumatic.     Right Ear: Tympanic membrane, ear canal and external ear normal. There is impacted cerumen. Tympanic membrane is not erythematous or bulging.     Left Ear: Ear canal and external ear normal. Tympanic  membrane is retracted. Tympanic membrane is not erythematous or bulging.     Ears:     Comments: Right ear: Cerumen impaction noted.  Able to visualize approximately 20% of TM that appears normal.    Nose:     Right Sinus: Maxillary sinus tenderness and frontal sinus tenderness present.     Left Sinus: Maxillary sinus tenderness and frontal sinus tenderness present.     Mouth/Throat:     Pharynx: Uvula midline. Postnasal drip present. No oropharyngeal exudate or posterior oropharyngeal erythema.   Cardiovascular:     Rate and Rhythm: Normal rate and regular rhythm.     Heart sounds: Normal heart sounds, S1 normal and S2 normal. No murmur heard. Pulmonary:     Effort: Pulmonary effort is normal.     Breath sounds: Normal breath sounds. No wheezing, rhonchi or rales.     Comments: Clear to auscultation bilaterally  Psychiatric:        Behavior: Behavior is cooperative.      UC Treatments / Results  Labs (all labs ordered are listed, but only abnormal results are displayed) Labs Reviewed - No data to display  EKG   Radiology No results found.  Procedures Procedures (including critical care time)  Medications Ordered in UC Medications - No data to display  Initial Impression / Assessment and Plan / UC Course  I have reviewed the triage vital signs and the nursing notes.  Pertinent labs & imaging results that were available during my care of the patient were reviewed by me and considered in my medical decision making (see chart for details).     No indication for viral testing as patient has been symptomatic for several weeks and this would not change management.  Given prolonged and worsening symptoms concern for secondary bacterial infection.  Patient was started on Augmentin BID x 10 days. Chest x-ray was deferred as her lungs were clear on exam and her oxygen was appropriate at 98%. Recommended over-the-counter medication including Mucinex, Flonase, Tylenol .  They are to  rest and drink plenty of fluid.  Discussed that if symptoms are not improving by next week they should return for reevaluation or see PCP.  If any symptoms worsen they develop high fever, chest pain, shortness of breath, nausea/vomiting interfering with oral intake, worsening cough they need to be seen immediately.  Strict return precautions given.  Work excuse note provided.   Final Clinical Impressions(s) / UC Diagnoses   Final diagnoses:  Acute non-recurrent pansinusitis  Sinus pressure     Discharge Instructions      We are treating you for a sinus infection.  Please start Augmentin twice daily for 10 days.  Use fluticasone nasal spray to help with congestion.  I also recommend over-the-counter medicine such as Mucinex, Tylenol , nasal saline/sinus rinses.  Antibiotics can decrease the effectiveness of your birth control so use backup birth control while you are on this medicine until your next period.  If you are not feeling  better in a week or if anything worsens and you have worsening cough, shortness of breath, chest pain, nausea, vomiting you need to be seen immediately.     ED Prescriptions     Medication Sig Dispense Auth. Provider   amoxicillin-clavulanate (AUGMENTIN) 875-125 MG tablet Take 1 tablet by mouth every 12 (twelve) hours. 20 tablet Fransico Sciandra K, PA-C   fluticasone (FLONASE) 50 MCG/ACT nasal spray Place 1 spray into both nostrils daily. 16 g Rylynn Schoneman K, PA-C      PDMP not reviewed this encounter.   Budd Cargo, PA-C 06/27/23 1326

## 2023-06-27 NOTE — Discharge Instructions (Signed)
 We are treating you for a sinus infection.  Please start Augmentin twice daily for 10 days.  Use fluticasone nasal spray to help with congestion.  I also recommend over-the-counter medicine such as Mucinex, Tylenol , nasal saline/sinus rinses.  Antibiotics can decrease the effectiveness of your birth control so use backup birth control while you are on this medicine until your next period.  If you are not feeling better in a week or if anything worsens and you have worsening cough, shortness of breath, chest pain, nausea, vomiting you need to be seen immediately.

## 2023-06-27 NOTE — ED Triage Notes (Signed)
 Pt presents c/o nasal congestion, cough, headache and dizzy spells x 2 to 3 weeks. Pt says her son was sick first then she thinks she caught whatever he had. Pt says her dizziness comes and goes and the mucus is thick and green colored.

## 2023-06-30 ENCOUNTER — Other Ambulatory Visit: Payer: Self-pay

## 2023-07-08 NOTE — Telephone Encounter (Signed)
 Copied from CRM #[id number]. Topic: Schedule Appointment - Schedule Patient >> Jul 08, 2023 12:21 PM Hunter POUR wrote: Susan Davis, Susan Davis is calling other request    Include all details related to the request(s) below: Patient called to schedule a new patient visit.  First available was not until Aug 13th, which we did go ahead and schedule for her and put the appointment on the wait list, but patient wanted to see if there was any spots that would be sooner that she could be worked into.  She has some medical forms that she needs completed and would like to come in sooner, if available.  Please contact patient back to advise.  Confirm and type the Best Contact Number below:  Patient/caller contact number: Pages [place]l / [phone]             [] Home  [x] Mobile  [] Work [] Other   [] Okay to leave a voicemail   Medication List:  Current Outpatient Medications:  .  benztropine  (COGENTIN ) 0.5 mg tablet, TAKE 1 TABLET (0.5 MG TOTAL) BY MOUTH 2 (TWO) TIMES DAILY, Disp: , Rfl:  .  divalproex  (DEPAKOTE  DR) 500 mg 12 hr tablet, Take  by mouth., Disp: , Rfl:  .  loratadine  (CLARITIN ) 10 mg tablet, Take 10 mg by mouth Once Daily., Disp: , Rfl:  .  OLANZapine  (ZyPREXA ) 10 mg tablet, , Disp: , Rfl:  .  ondansetron  (ZOFRAN ) 4 mg tablet, Take 4 mg by mouth every 8 (eight) hours as needed for nausea., Disp: 21 tablet, Rfl: 0     Medication Request/Refills: Pharmacy Information (if applicable)   [x] Not Applicable       []  Pharmacy listed  Send Medication Request to:                                                 [] Pharmacy not listed (added to pharmacy list in Epic) Send Medication Request to:      Listed Pharmacies: [id number] [place], [place] - [address] - PHONE: [phone] - FAX: [phone]

## 2023-07-30 ENCOUNTER — Other Ambulatory Visit: Payer: Self-pay

## 2023-07-30 ENCOUNTER — Encounter (HOSPITAL_COMMUNITY): Payer: Self-pay | Admitting: Physician Assistant

## 2023-07-30 ENCOUNTER — Telehealth (HOSPITAL_COMMUNITY): Payer: MEDICAID | Admitting: Physician Assistant

## 2023-07-30 DIAGNOSIS — F3173 Bipolar disorder, in partial remission, most recent episode manic: Secondary | ICD-10-CM | POA: Diagnosis not present

## 2023-07-30 MED ORDER — OLANZAPINE 20 MG PO TABS
20.0000 mg | ORAL_TABLET | Freq: Every day | ORAL | 3 refills | Status: AC
Start: 2023-07-30 — End: ?
  Filled 2023-08-27: qty 30, 30d supply, fill #0
  Filled 2023-09-29: qty 30, 30d supply, fill #1

## 2023-07-30 NOTE — Progress Notes (Signed)
 [facility] MD/PA/NP OP Progress Note  Virtual Visit via Video Note  I connected with Susan Davis on 07/30/23 at 11:00 AM EDT by a video enabled telemedicine application and verified that I am speaking with the correct person using two identifiers.  Location: Patient: Home Provider: Clinic   I discussed the limitations of evaluation and management by telemedicine and the availability of in person appointments. The patient expressed understanding and agreed to proceed.  Follow Up Instructions:   I discussed the assessment and treatment plan with the patient. The patient was provided an opportunity to ask questions and all were answered. The patient agreed with the plan and demonstrated an understanding of the instructions.   The patient was advised to call back or seek an in-person evaluation if the symptoms worsen or if the condition fails to improve as anticipated.  I provided 8 minutes of non-face-to-face time during this encounter.  Reginia FORBES Bolster, PA    07/30/2023 7:40 PM Susan Davis  MRN:  [id number]  Chief Complaint:  Chief Complaint  Patient presents with   Follow-up   Medication Refill   HPI:   Susan Davis is a 28 year old female with a past psychiatric history significant for bipolar disorder (in partial remission, most recent episode manic) who presents to [facility] via virtual video visit for follow-up and medication management.  Patient is currently being managed on the following psychiatric medication: Olanzapine  20 mg at bedtime.  Patient reports that she has been taking her olanzapine  regularly and denies experiencing any adverse side effects.  Patient denies overt depressive symptoms nor does she endorse anxiety.  Patient denies any recent episodes of mania.  Patient further denies any new stressors.  A PHQ-9 screen was performed with the patient scoring a 0.  A GAD-7 screen was also performed with the patient  scoring a 2.  Patient is alert and oriented x 4, calm, cooperative, and fully engaged in conversation during the encounter.  Patient endorses good mood.  Patient exhibits euthymic mood with appropriate affect.  Patient denies suicidal or homicidal ideations.  She further denies auditory or visual hallucinations and does not appear to be responding to internal/external stimuli.  Patient endorses good sleep and receives on average 8 hours of sleep per night.  Patient endorses good appetite and eats on average 3 meals per day.  Patient denies alcohol consumption, tobacco use, or illicit drug use.  Visit Diagnosis:    ICD-10-CM   1. Bipolar disorder, in partial remission, most recent episode manic (HCC)  F31.73 OLANZapine  (ZYPREXA ) 20 MG tablet      Past Psychiatric History:  Bipolar disorder (in partial remission, most recent episode manic) Depression Anxiety  Past Medical History:  Past Medical History:  Diagnosis Date   Anxiety    Bipolar 1 disorder (HCC)    Depression    Encounter for supervision of normal first pregnancy in first trimester 05/24/2022              NURSING     PROVIDER      Office Location    [facility]    Dating by    LMP c/w U/S at 9 wks      [facility] Model    Traditional    Anatomy U/S           Initiated care at     [facility]  Language     English                     LAB RESULTS       Support Person    FOB- Musician and Mom     Genetics    NIPS:       low risk female      JQE:wnmfjo                      NT/IT (FT only)      Gestational diabetes    Medical history non-contributory     Past Surgical History:  Procedure Laterality Date   NO PAST SURGERIES      Family Psychiatric History:  Patient notes that several people in her family suffer from anxiety and depression however she could not give provider specifics of who they were.   Family History:  Family History  Problem Relation Age of Onset   Hypertension Mother    Diabetes Mother     Social  History:  Social History   Socioeconomic History   Marital status: Single    Spouse name: Not on file   Number of children: Not on file   Years of education: Not on file   Highest education level: Not on file  Occupational History   Not on file  Tobacco Use   Smoking status: Never    Passive exposure: Never   Smokeless tobacco: Never  Vaping Use   Vaping status: Never Used  Substance and Sexual Activity   Alcohol use: Not Currently    Comment: occasionally, not since confirmed pregnancy   Drug use: Not Currently    Types: Marijuana    Comment: pt states its been 1-2 months, not since 04/14/22   Sexual activity: Yes    Partners: Male    Birth control/protection: None  Other Topics Concern   Not on file  Social History Narrative   Not on file   Social Drivers of Health   Financial Resource Strain: Not on file  Food Insecurity: No Food Insecurity (12/02/2022)   Hunger Vital Sign    Worried About Running Out of Food in the Last Year: Never true    Ran Out of Food in the Last Year: Never true  Transportation Needs: No Transportation Needs (12/02/2022)   PRAPARE - Administrator, Civil Service (Medical): No    Lack of Transportation (Non-Medical): No  Physical Activity: Not on file  Stress: Not on file  Social Connections: Not on file    Allergies:  Allergies  Allergen Reactions   Other Other (See Comments)    Allergic to Unknown Anxiety Medication which causes twitching (2021)  Regarding entry above- is this Hydroxyzine ?? (2024)   Hydroxyzine  Pamoate Swelling, Anxiety and Other (See Comments)    Shaking made feel like having a panic attack    Metabolic Disorder Labs: Lab Results  Component Value Date   HGBA1C 5.5 04/04/2022   MPG 111 04/04/2022   Lab Results  Component Value Date   PROLACTIN 23.0 09/24/2021   PROLACTIN 52.1 (H) 08/14/2020   Lab Results  Component Value Date   CHOL 125 04/04/2022   TRIG 72 04/04/2022   HDL 58  04/04/2022   CHOLHDL 2.2 04/04/2022   VLDL 14 04/04/2022   LDLCALC 53 04/04/2022   LDLCALC 81 05/19/2018   Lab Results  Component Value Date   TSH 1.010 04/04/2022   TSH 1.430 09/24/2021  Therapeutic Level Labs: No results found for: LITHIUM Lab Results  Component Value Date   VALPROATE <10 (L) 04/05/2022   VALPROATE 29 (L) 09/24/2021   No results found for: CBMZ  Current Medications: Current Outpatient Medications  Medication Sig Dispense Refill   amoxicillin -clavulanate (AUGMENTIN ) 875-125 MG tablet Take 1 tablet by mouth every 12 (twelve) hours. 20 tablet 0   fluticasone  (FLONASE ) 50 MCG/ACT nasal spray Place 1 spray into both nostrils daily. 16 g 0   Norethindrone  Acetate-Ethinyl Estrad-FE (JUNEL  FE 24) 1-20 MG-MCG(24) tablet Take 1 tablet by mouth daily. 84 tablet 3   OLANZapine  (ZYPREXA ) 20 MG tablet Take 1 tablet (20 mg total) by mouth at bedtime. 30 tablet 3   No current facility-administered medications for this visit.     Musculoskeletal: Strength & Muscle Tone: within normal limits Gait & Station: normal Patient leans: N/A  Psychiatric Specialty Exam: Review of Systems  Psychiatric/Behavioral:  Negative for decreased concentration, dysphoric mood, hallucinations, self-injury, sleep disturbance and suicidal ideas. The patient is not nervous/anxious and is not hyperactive.     not currently breastfeeding.There is no height or weight on file to calculate BMI.  General Appearance: Casual  Eye Contact:  Good  Speech:  Clear and Coherent and Normal Rate  Volume:  Normal  Mood:  Euthymic  Affect:  Appropriate  Thought Process:  Coherent, Goal Directed, and Descriptions of Associations: Intact  Orientation:  Full (Time, Place, and Person)  Thought Content: WDL   Suicidal Thoughts:  No  Homicidal Thoughts:  No  Memory:  Immediate;   Good Recent;   Good Remote;   Good  Judgement:  Good  Insight:  Good  Psychomotor Activity:  Normal  Concentration:   Concentration: Good and Attention Span: Good  Recall:  Good  Fund of Knowledge: Good  Language: Good  Akathisia:  No  Handed:  Right  AIMS (if indicated): not done  Assets:  Communication Skills Desire for Improvement Financial Resources/Insurance Housing Social Support  ADL's:  Intact  Cognition: WNL  Sleep:  Good   Screenings: AIMS    Flowsheet Row Video Visit from 07/30/2023 in [facility]) [facility]  AIMS Total Score 0 0 0   AUDIT    Flowsheet Row [facility] ([facility]  Alcohol Use Disorder Identification Test Final Score (AUDIT) 6 0   GAD-7    Flowsheet Row Video Visit from 07/30/2023 in [facility] Video Visit from 05/21/2023 in [facility] Video Visit from 02/26/2023 in [facility] Postpartum Visit from [facility]/09/2023 [facility] Video Visit from 12/26/2022 in [facility]  Total GAD-7 Score 2 4 3  0 6   PHQ2-9    Flowsheet Row Video Visit from 07/30/2023 in [facility] Video Visit from 05/21/2023 in [facility] Video Visit from 02/26/2023 in [facility] Postpartum Visit from 01/09/2023 in [facility] Video Visit from 12/26/2022 in [facility]  PHQ-2 Total Score 0 0 1 0 1  PHQ-9 Total Score -- 1 2 3 5    Flowsheet Row Video Visit from  07/30/2023 in [facility] UC from 06/27/2023 in [facility]) Video Visit from 12/26/2022 in [facility]  C-SSRS RISK CATEGORY No Risk No Risk No Risk     Assessment and Plan:   Susan Davis is a 28 year old female with a past psychiatric history significant for bipolar disorder (in partial remission, most recent episode manic) who presents to [facility] via virtual video visit for follow-up and medication management.  Patient presents to the encounter stating that she has been taking her olanzapine  regularly and denies experiencing any adverse side effects associated with her use of the medication.  Patient denies any overt depressive symptoms nor does she endorse anxiety.  A PHQ-9 screen was performed with the patient scoring a 0.  A GAD-7 screen was also performed with the patient scoring a 2.  Patient further denies recent episodes of mania.  Patient endorses stability through the use of her olanzapine .  Patient to continue taking the medication as prescribed.  Patient's medication to be e-prescribed to pharmacy of choice.  A [place] Suicide Severity Rating Scale was performed with the patient being considered no risk.  Patient denies suicidal ideations and is able to contract for safety at this time.    Patient has pending labs.  Collaboration of Care: Collaboration of Care: Medication Management AEB provider managing patient's psychiatric medications, Primary Care Provider AEB patient being followed by a family medicine provider, and Psychiatrist AEB patient being followed by mental health provider at this facility  Patient/Guardian was advised Release of Information must be obtained prior to any record release in order to collaborate their care with an outside provider. Patient/Guardian was advised if they have not already done so to contact the registration department to sign all necessary forms in order for us  to release  information regarding their care.   Consent: Patient/Guardian gives verbal consent for treatment and assignment of benefits for services provided during this visit. Patient/Guardian expressed understanding and agreed to proceed.   1. Bipolar disorder, in partial remission, most recent episode manic (HCC)  - OLANZapine  (ZYPREXA ) 20 MG tablet; Take 1 tablet (20 mg total) by mouth at bedtime.  Dispense: 30 tablet; Refill: 3  Patient to follow up in 2 months Provider spent a total of 8 minutes with the patient/reviewing patient's chart  Reginia FORBES Bolster, PA 07/30/2023, 7:40 PM

## 2023-07-31 ENCOUNTER — Other Ambulatory Visit: Payer: Self-pay

## 2023-08-04 ENCOUNTER — Other Ambulatory Visit: Payer: Self-pay

## 2023-08-27 ENCOUNTER — Other Ambulatory Visit: Payer: Self-pay

## 2023-09-01 ENCOUNTER — Other Ambulatory Visit: Payer: Self-pay

## 2023-09-29 ENCOUNTER — Other Ambulatory Visit: Payer: Self-pay

## 2023-10-01 ENCOUNTER — Telehealth (INDEPENDENT_AMBULATORY_CARE_PROVIDER_SITE_OTHER): Payer: MEDICAID | Admitting: Psychiatry

## 2023-10-01 ENCOUNTER — Encounter (HOSPITAL_COMMUNITY): Payer: Self-pay | Admitting: Psychiatry

## 2023-10-01 ENCOUNTER — Other Ambulatory Visit: Payer: Self-pay

## 2023-10-01 DIAGNOSIS — F3173 Bipolar disorder, in partial remission, most recent episode manic: Secondary | ICD-10-CM

## 2023-10-01 MED ORDER — OLANZAPINE 20 MG PO TABS
20.0000 mg | ORAL_TABLET | Freq: Every day | ORAL | 3 refills | Status: DC
  Filled 2023-10-01 – 2023-10-27 (×2): qty 30, 30d supply, fill #0
  Filled 2023-11-27: qty 30, 30d supply, fill #1

## 2023-10-01 NOTE — Progress Notes (Signed)
 BH MD/PA/NP OP Progress Note. Virtual Visit via Video Note  I connected with Susan Davis on 10/01/23 at 11:00 AM EDT by a video enabled telemedicine application and verified that I am speaking with the correct person using two identifiers.  Location: Patient: Home Provider: Clinic   I discussed the limitations of evaluation and management by telemedicine and the availability of in person appointments. The patient expressed understanding and agreed to proceed.  I provided 30 minutes of non-face-to-face time during this encounter.          10/01/2023 8:53 AM Susan Davis  MRN:  [id number]  Chief Complaint: I lost my job   HPI: 28 year old female seen today for follow up psychiatric evaluation.   She has a psychiatric history of bipolar disorder, anxiety, and depression.  Patients currently managed on Zyprexa   20 mg nightly. She notes that her medications are effective in managing her psychiatric conditions.    Today she was well groomed,  pleasant, cooperative, and engaged in conversation.  She informed Clinical research associate that recently she lost her job at [employer].  She informed Clinical research associate that she was unable to keep up with the fast pace.  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  Patient notes that she is somewhat worried about finances and the wellbeing of her 68-month-old son.  She does note that she has support from her mother, sister, and her sons father.  Despite the stressors patient reports that her mental health is stable.  She reports that she has minimal anxiety and depression.   Today provider conducted a GAD-7 and patient scored a 2, at her last visit she scored an 2.  Provider also conducted PHQ-9 patient 2, at her last visit to scored a 0.  Patient reports that her appetite and sleep are adequate.  Today she denies SI/HI/VAH, mania, paranoia.   No medication changes made today.  Patient  agreeable to continue medications as prescribed.  No other concerns noted at this time.   Visit Diagnosis:    ICD-10-CM   1. Bipolar disorder, in partial remission, most recent episode manic (HCC)  F31.73 OLANZapine  (ZYPREXA ) 20 MG tablet            Past Psychiatric History: Depression, Anxiety, and Bipolar affective Past Medical History:  Past Medical History:  Diagnosis Date   Anxiety    Bipolar 1 disorder (HCC)    Depression    Encounter for supervision of normal first pregnancy in first trimester 05/24/2022              NURSING     PROVIDER      Office Location    [facility]    Dating by    LMP c/w U/S at 9 wks      [facility]    Traditional    Anatomy U/S           Initiated care at     [employer]     English                     LAB RESULTS       Support Person    FOB- [facility]     Genetics    NIPS:  low risk female      JQE:wnmfjo                      NT/IT (FT only)      Gestational diabetes    Medical history non-contributory     Past Surgical History:  Procedure Laterality Date   NO PAST SURGERIES      Family Psychiatric History: Patient notes that several people in her family suffer from anxiety and depression however she could not give provider specifics of who they were.    Family History:  Family History  Problem Relation Age of Onset   Hypertension Mother    Diabetes Mother     Social History:  Social History   Socioeconomic History   Marital status: Single    Spouse name: Not on file   Number of children: Not on file   Years of education: Not on file   Highest education level: Not on file  Occupational History   Not on file  Tobacco Use   Smoking status: Never    Passive exposure: Never   Smokeless tobacco: Never  Vaping Use   Vaping status: Never Used  Substance and Sexual Activity   Alcohol use: Not Currently    Comment: occasionally, not since confirmed pregnancy   Drug use: Not Currently    Types:  Marijuana    Comment: pt states its been 1-2 months, not since 04/14/22   Sexual activity: Yes    Partners: Male    Birth control/protection: None  Other Topics Concern   Not on file  Social History Narrative   Not on file   Social Drivers of Health   Financial Resource Strain: Not on file  Food Insecurity: Low Risk  (08/27/2023)   Received from [facility]   Hunger Vital Sign    Within the past 12 months, you worried that your food would run out before you got money to buy more: Never true    Within the past 12 months, the food you bought just didn't last and you didn't have money to get more. : Never true  Transportation Needs: No Transportation Needs (08/27/2023)   Received from [employer]    In the past 12 months, has lack of reliable transportation kept you from medical appointments, meetings, work or from getting things needed for daily living? : No  Physical Activity: Not on file  Stress: Not on file  Social Connections: Not on file    Allergies:  Allergies  Allergen Reactions   Other Other (See Comments)    Allergic to Unknown Anxiety Medication which causes twitching (2021)  Regarding entry above- is this Hydroxyzine ?? (2024)   Hydroxyzine  Pamoate Swelling, Anxiety and Other (See Comments)    Shaking made feel like having a panic attack    Metabolic Disorder Labs: Lab Results  Component Value Date   HGBA1C 5.5 04/04/2022   MPG 111 04/04/2022   Lab Results  Component Value Date   PROLACTIN 23.0 09/24/2021   PROLACTIN 52.1 (H) 08/14/2020   Lab Results  Component Value Date   CHOL 125 04/04/2022   TRIG 72 04/04/2022   HDL 58 04/04/2022   CHOLHDL 2.2 04/04/2022   VLDL 14 04/04/2022   LDLCALC 53 04/04/2022   LDLCALC 81 05/19/2018   Lab Results  Component Value Date   TSH 1.010 04/04/2022   TSH 1.430 09/24/2021    Therapeutic Level Labs: No results found for: LITHIUM Lab Results  Component Value Date   VALPROATE <10 (L)  04/05/2022   VALPROATE 29 (L) 09/24/2021   No results found for: CBMZ  Current Medications: Current Outpatient Medications  Medication Sig Dispense Refill   amoxicillin -clavulanate (AUGMENTIN ) 875-125 MG tablet Take 1 tablet by mouth every 12 (twelve) hours. 20 tablet 0   fluticasone  (FLONASE ) 50 MCG/ACT nasal spray Place 1 spray into both nostrils daily. 16 g 0   Norethindrone  Acetate-Ethinyl Estrad-FE (JUNEL  FE 24) 1-20 MG-MCG(24) tablet Take 1 tablet by mouth daily. 84 tablet 3   OLANZapine  (ZYPREXA ) 20 MG tablet Take 1 tablet (20 mg total) by mouth at bedtime. 30 tablet 3   No current facility-administered medications for this visit.     Musculoskeletal: Strength & Muscle Tone: within normal limits and Telehealth visit Gait & Station: normal, Telehealth visit Patient leans: N/A  Psychiatric Specialty Exam: Review of Systems  not currently breastfeeding.There is no height or weight on file to calculate BMI.  General Appearance: Well Groomed  Eye Contact:  Good  Speech:  Clear and Coherent and Normal Rate  Volume:  Normal  Mood:  Euthymic,   Affect:  Appropriate and Congruent  Thought Process:  Coherent, Goal Directed and Linear  Orientation:  Full (Time, Place, and Person)  Thought Content: WDL and Logical   Suicidal Thoughts:  No  Homicidal Thoughts:  No  Memory:  Immediate;   Good Recent;   Good Remote;   Good  Judgement:  Good  Insight:  Good  Psychomotor Activity:  Normal  Concentration:  Concentration: Good and Attention Span: Good  Recall:  Good  Fund of Knowledge: Good  Language: Good  Akathisia:  No  Handed:  Right  AIMS (if indicated): Not done  Assets:  Communication Skills Desire for Improvement Financial Resources/Insurance Housing Social Support  ADL's:  Intact  Cognition: WNL  Sleep:  Good   Screenings: AIMS    Flowsheet Row Video Visit from 07/30/2023 in [facility]) [facility]) [facility]  AIMS Total Score 0 0 0   AUDIT    Flowsheet Row Ad[facility]  Alcohol Use Disorder Identification Test Final Score (AUDIT) 6 0   GAD-7    Flowsheet Row Video Visit from 10/01/2023 in [facility] Video Visit from 07/30/2023 in [facility] Video Visit from 05/21/2023 in [facility] Video Visit from 02/26/2023 in [facility] Postpartum Visit [facility]  Total GAD-7 Score 2 2 4 3  0   PHQ2-9    Flowsheet Row Video Visit from 10/01/2023 in [facility] Video Visit from 07/30/2023 in [facility] Video Visit from 05/21/2023 in [facility] Video Visit from 02/26/2023 in [facility] Postpartum Visit from 01/09/2023 in [facility]  PHQ-2 Total Score 0 0 0 1 0  PHQ-9 Total Score 1 -- 1 2 3    Flowsheet Row Video Visit from 07/30/2023 in [facility] from [facility]  C-SSRS RISK CATEGORY No Risk No Risk No Risk     Assessment and Plan: Patient reports she has been doing well on her current medication regimen. Patient will continue Zyprexa  as prescribed.   1. Bipolar disorder, in partial remission, most recent episode manic (HCC)  Continue- OLANZapine  (ZYPREXA ) 20 MG tablet; Take 1 tablet (20 mg total) by mouth at  bedtime.  Dispense: 30 tablet; Refill: 3   Follow up in 3 months  Zane FORBES Bach, NP 10/01/2023, 8:53 AM

## 2023-10-06 ENCOUNTER — Telehealth (HOSPITAL_COMMUNITY): Payer: MEDICAID | Admitting: Psychiatry

## 2023-10-27 ENCOUNTER — Other Ambulatory Visit: Payer: Self-pay

## 2023-11-10 ENCOUNTER — Telehealth (HOSPITAL_COMMUNITY): Payer: Self-pay

## 2023-11-10 NOTE — Telephone Encounter (Signed)
 Patient called in, left voicemail questing a call from provider. As she is needing a letter to provide the housing authority

## 2023-11-11 ENCOUNTER — Encounter (HOSPITAL_COMMUNITY): Payer: Self-pay | Admitting: Psychiatry

## 2023-11-11 NOTE — Telephone Encounter (Signed)
 Letter written. Patient made aware and told that it will be in her MyChart.

## 2023-11-28 ENCOUNTER — Other Ambulatory Visit: Payer: Self-pay

## 2023-12-31 ENCOUNTER — Other Ambulatory Visit: Payer: Self-pay

## 2023-12-31 ENCOUNTER — Telehealth (INDEPENDENT_AMBULATORY_CARE_PROVIDER_SITE_OTHER): Payer: MEDICAID | Admitting: Psychiatry

## 2023-12-31 ENCOUNTER — Encounter (HOSPITAL_COMMUNITY): Payer: Self-pay | Admitting: Psychiatry

## 2023-12-31 DIAGNOSIS — F3173 Bipolar disorder, in partial remission, most recent episode manic: Secondary | ICD-10-CM

## 2023-12-31 MED ORDER — OLANZAPINE 20 MG PO TABS
20.0000 mg | ORAL_TABLET | Freq: Every day | ORAL | 3 refills | Status: AC
  Filled 2023-12-31: qty 30, 30d supply, fill #0
  Filled 2024-01-26: qty 30, 30d supply, fill #1

## 2023-12-31 NOTE — Progress Notes (Signed)
 BH MD/PA/NP OP Progress Note. Virtual Visit via Video Note  I connected with Susan Davis on 12/31/2023 at  9:00 AM EST by a video enabled telemedicine application and verified that I am speaking with the correct person using two identifiers.  Location: Patient: Home Provider: Clinic   I discussed the limitations of evaluation and management by telemedicine and the availability of in person appointments. The patient expressed understanding and agreed to proceed.  I provided 30 minutes of non-face-to-face time during this encounter.          12/31/2023 10:58 AM Susan Davis  MRN:  [id number]  Chief Complaint: I am doing good.   HPI: 28 year old female seen today for follow up psychiatric evaluation.   She has a psychiatric history of bipolar disorder, anxiety, and depression.  Patients currently managed on Zyprexa   20 mg nightly. She notes that her medications are effective in managing her psychiatric conditions.    Today she was well groomed,  pleasant, cooperative, and engaged in conversation.  She informed clinical research associate that she has been doing okay.  She notes that recently she got a job working at [employer].  She notes that she has been there for the last month and finds enjoyable in her job.  Patient notes that her mood is stable and reports that she has minimal anxiety and depression.  Today provider conducted a GAD-7 and patient scored a 3, at her last visit she scored 2.  Provider also conducted PHQ-9 and patient scored a 2, her last visit she scored a 2.  Patient notes that she sleeps approximately 6 hours.  Today she denies SI/HI/VAH, mania, paranoia.    Patient informed clinical research associate that her son is doing well.  She notes that she is looking forward to the holiday to spend with him.  No medication changes made today.  Patient agreeable to continue medications as prescribed.  No other concerns noted at this time.   Visit Diagnosis:    ICD-10-CM   1. Bipolar  disorder, in partial remission, most recent episode manic (HCC)  F31.73 OLANZapine  (ZYPREXA ) 20 MG tablet             Past Psychiatric History: Depression, Anxiety, and Bipolar affective Past Medical History:  Past Medical History:  Diagnosis Date   Anxiety    Bipolar 1 disorder (HCC)    Depression    Encounter for supervision of normal first pregnancy in first trimester 05/24/2022              NURSING     PROVIDER      Office Location    [facility]    Dating by    LMP c/w U/S at 9 wks      [facility] Model    Traditional    Anatomy U/S           Initiated care at     [employer]     English                     LAB RESULTS       Support Person    FOB- [facility]     Genetics    NIPS:       low risk female      JQE:wnmfjo  NT/IT (FT only)      Gestational diabetes    Medical history non-contributory     Past Surgical History:  Procedure Laterality Date   NO PAST SURGERIES      Family Psychiatric History: Patient notes that several people in her family suffer from anxiety and depression however she could not give provider specifics of who they were.    Family History:  Family History  Problem Relation Age of Onset   Hypertension Mother    Diabetes Mother     Social History:  Social History   Socioeconomic History   Marital status: Single    Spouse name: Not on file   Number of children: Not on file   Years of education: Not on file   Highest education level: Not on file  Occupational History   Not on file  Tobacco Use   Smoking status: Never    Passive exposure: Never   Smokeless tobacco: Never  Vaping Use   Vaping status: Never Used  Substance and Sexual Activity   Alcohol use: Not Currently    Comment: occasionally, not since confirmed pregnancy   Drug use: Not Currently    Types: Marijuana    Comment: pt states its been 1-2 months, not since 04/14/22   Sexual activity: Yes    Partners: Male    Birth  control/protection: None  Other Topics Concern   Not on file  Social History Narrative   Not on file   Social Drivers of Health   Tobacco Use: Low Risk (10/01/2023)   Patient History    Smoking Tobacco Use: Never    Smokeless Tobacco Use: Never    Passive Exposure: Never  Recent Concern: Tobacco Use - Medium Risk (08/27/2023)   Received from [facility]   Patient History    Smoking Tobacco Use: Former    Smokeless Tobacco Use: Never    Passive Exposure: Not on Actuary Strain: Not on file  Food Insecurity: Low Risk (08/27/2023)   Received from [facility]   Epic    Within the past 12 months, you worried that your food would run out before you got money to buy more: Never true    Within the past 12 months, the food you bought just didn't last and you didn't have money to get more. : Never true  Transportation Needs: No Transportation Needs (08/27/2023)   Received from [facility]    In the past 12 months, has lack of reliable transportation kept you from medical appointments, meetings, work or from getting things needed for daily living? : No  Physical Activity: Not on file  Stress: Not on file  Social Connections: Not on file  Depression (PHQ2-9): Low Risk (12/31/2023)   Depression (PHQ2-9)    PHQ-2 Score: 2  Alcohol Screen: Low Risk (04/03/2022)   Alcohol Screen    Last Alcohol Screening Score (AUDIT): 6  Housing: Low Risk (08/27/2023)   Received from [facility]   Epic    What is your living situation today?: I have a steady place to live    Think about the place you live. Do you have problems with any of the following? Choose all that apply:: None/None on this list  Utilities: Low Risk (08/27/2023)   Received from [facility]   Utilities    In the past 12 months has the electric, gas, oil, or water  company threatened to shut off services in your home? : No  Health Literacy:  Not on file    Allergies:  Allergies  Allergen  Reactions   Other Other (See Comments)    Allergic to Unknown Anxiety Medication which causes twitching (2021)  Regarding entry above- is this Hydroxyzine ?? (2024)   Hydroxyzine  Pamoate Swelling, Anxiety and Other (See Comments)    Shaking made feel like having a panic attack    Metabolic Disorder Labs: Lab Results  Component Value Date   HGBA1C 5.5 04/04/2022   MPG 111 04/04/2022   Lab Results  Component Value Date   PROLACTIN 23.0 09/24/2021   PROLACTIN 52.1 (H) 08/14/2020   Lab Results  Component Value Date   CHOL 125 04/04/2022   TRIG 72 04/04/2022   HDL 58 04/04/2022   CHOLHDL 2.2 04/04/2022   VLDL 14 04/04/2022   LDLCALC 53 04/04/2022   LDLCALC 81 05/19/2018   Lab Results  Component Value Date   TSH 1.010 04/04/2022   TSH 1.430 09/24/2021    Therapeutic Level Labs: No results found for: LITHIUM Lab Results  Component Value Date   VALPROATE <10 (L) 04/05/2022   VALPROATE 29 (L) 09/24/2021   No results found for: CBMZ  Current Medications: Current Outpatient Medications  Medication Sig Dispense Refill   amoxicillin -clavulanate (AUGMENTIN ) 875-125 MG tablet Take 1 tablet by mouth every 12 (twelve) hours. 20 tablet 0   fluticasone  (FLONASE ) 50 MCG/ACT nasal spray Place 1 spray into both nostrils daily. 16 g 0   Norethindrone  Acetate-Ethinyl Estrad-FE (JUNEL  FE 24) 1-20 MG-MCG(24) tablet Take 1 tablet by mouth daily. 84 tablet 3   OLANZapine  (ZYPREXA ) 20 MG tablet Take 1 tablet (20 mg total) by mouth at bedtime. 30 tablet 3   No current facility-administered medications for this visit.     Musculoskeletal: Strength & Muscle Tone: within normal limits and Telehealth visit Gait & Station: normal, Telehealth visit Patient leans: N/A  Psychiatric Specialty Exam: Review of Systems  not currently breastfeeding.There is no height or weight on file to calculate BMI.  General Appearance: Well Groomed  Eye Contact:  Good  Speech:  Clear and Coherent  and Normal Rate  Volume:  Normal  Mood:  Euthymic,   Affect:  Appropriate and Congruent  Thought Process:  Coherent, Goal Directed and Linear  Orientation:  Full (Time, Place, and Person)  Thought Content: WDL and Logical   Suicidal Thoughts:  No  Homicidal Thoughts:  No  Memory:  Immediate;   Good Recent;   Good Remote;   Good  Judgement:  Good  Insight:  Good  Psychomotor Activity:  Normal  Concentration:  Concentration: Good and Attention Span: Good  Recall:  Good  Fund of Knowledge: Good  Language: Good  Akathisia:  No  Handed:  Right  AIMS (if indicated): Not done  Assets:  Communication Skills Desire for Improvement Financial Resources/Insurance Housing Social Support  ADL's:  Intact  Cognition: WNL  Sleep:  Good   Screenings: AIMS    Flowsheet Row Video Visit from 07/30/2023 in [facility] Ad[facility] (Discharged) [facility] ([facility]) [facility]  AIMS Total Score 0 0 0   AUDIT    Flowsheet Row Admission (Discharged) [facility] (Discharged[facility]  Alcohol Use Disorder Identification Test Final Score (AUDIT) 6 0   GAD-7    Flowsheet Row Video Visit from 12/31/2023 in [facility] Video Visit from 10/01/2023 in [facility] Video Visit from 07/30/2023 in [facility] Video Visit from 05/21/2023 in [facility] Video Visit from 02/26/2023 in [facility]  Total GAD-7 Score 3 2 2 4 3    PHQ2-9    Flowsheet Row Video Visit from 12/31/2023 in [facility] Video Visit from 10/01/2023 in [facility] Video Visit from  07/30/2023 in [facility] Video Visit from 05/21/2023 in [facility] Video Visit from 02/26/2023 in [facility]  PHQ-2 Total Score 0 0 0 0 1  PHQ-9 Total Score 2 1 -- 1 2   Flowsheet Row Video Visit from 07/30/2023 in [facility] from 06/27/2023 in [facility] Video Visit from 12/26/2022 in [facility]  C-SSRS RISK CATEGORY No Risk No Risk No Risk     Assessment and Plan: Patient reports she has been doing well on her current medication regimen. Patient will continue Zyprexa  as prescribed.   1. Bipolar disorder, in partial remission, most recent episode manic (HCC)  Continue- OLANZapine  (ZYPREXA ) 20 MG tablet; Take 1 tablet (20 mg total) by mouth at bedtime.  Dispense: 30 tablet; Refill: 3   Follow up in 3 months  Zane FORBES Bach, NP 12/31/2023, 10:58 AM

## 2024-01-01 ENCOUNTER — Other Ambulatory Visit: Payer: Self-pay

## 2024-02-02 ENCOUNTER — Other Ambulatory Visit: Payer: Self-pay

## 2024-02-05 ENCOUNTER — Other Ambulatory Visit: Payer: Self-pay

## 2024-03-10 ENCOUNTER — Telehealth (HOSPITAL_COMMUNITY): Payer: MEDICAID | Admitting: Psychiatry
